# Patient Record
Sex: Female | Born: 1970 | Race: White | Hispanic: No | Smoking: Current every day smoker
Health system: Southern US, Community
[De-identification: ages and names within clinical notes are randomized; demographics above are authoritative.]

## PROBLEM LIST (undated history)

## (undated) DIAGNOSIS — A6920 Lyme disease, unspecified: Secondary | ICD-10-CM

## (undated) DIAGNOSIS — N809 Endometriosis, unspecified: Secondary | ICD-10-CM

## (undated) DIAGNOSIS — M549 Dorsalgia, unspecified: Secondary | ICD-10-CM

## (undated) DIAGNOSIS — G8929 Other chronic pain: Secondary | ICD-10-CM

## (undated) HISTORY — PX: ABDOMINAL HYSTERECTOMY: SHX81

## (undated) HISTORY — DX: Endometriosis, unspecified: N80.9

## (undated) HISTORY — PX: GASTRIC BYPASS: SHX52

---

## 2011-04-30 ENCOUNTER — Other Ambulatory Visit (HOSPITAL_COMMUNITY): Payer: Self-pay | Admitting: Physical Medicine and Rehabilitation

## 2011-04-30 ENCOUNTER — Ambulatory Visit (HOSPITAL_COMMUNITY)
Admission: RE | Admit: 2011-04-30 | Discharge: 2011-04-30 | Disposition: A | Discharge status: Home / Self Care | Payer: Medicaid Other | Source: Ambulatory Visit | Attending: Physical Medicine and Rehabilitation | Admitting: Physical Medicine and Rehabilitation

## 2011-04-30 DIAGNOSIS — M19019 Primary osteoarthritis, unspecified shoulder: Secondary | ICD-10-CM

## 2011-04-30 DIAGNOSIS — R6889 Other general symptoms and signs: Secondary | ICD-10-CM

## 2011-04-30 DIAGNOSIS — M545 Low back pain, unspecified: Secondary | ICD-10-CM

## 2011-04-30 DIAGNOSIS — R209 Unspecified disturbances of skin sensation: Secondary | ICD-10-CM

## 2011-04-30 DIAGNOSIS — IMO0002 Reserved for concepts with insufficient information to code with codable children: Secondary | ICD-10-CM

## 2011-04-30 DIAGNOSIS — M79609 Pain in unspecified limb: Secondary | ICD-10-CM

## 2011-04-30 DIAGNOSIS — M542 Cervicalgia: Secondary | ICD-10-CM

## 2011-04-30 DIAGNOSIS — M25519 Pain in unspecified shoulder: Secondary | ICD-10-CM | POA: Insufficient documentation

## 2011-04-30 DIAGNOSIS — M5126 Other intervertebral disc displacement, lumbar region: Secondary | ICD-10-CM

## 2011-05-04 ENCOUNTER — Other Ambulatory Visit: Payer: Self-pay | Admitting: Physical Medicine and Rehabilitation

## 2011-05-04 DIAGNOSIS — M545 Low back pain, unspecified: Secondary | ICD-10-CM

## 2011-05-04 DIAGNOSIS — M5126 Other intervertebral disc displacement, lumbar region: Secondary | ICD-10-CM

## 2011-05-07 ENCOUNTER — Other Ambulatory Visit: Payer: Medicaid Other

## 2012-10-05 ENCOUNTER — Emergency Department (HOSPITAL_COMMUNITY)
Admission: EM | Admit: 2012-10-05 | Discharge: 2012-10-05 | Disposition: A | Discharge status: Home / Self Care | Payer: Self-pay | Attending: Emergency Medicine | Admitting: Emergency Medicine

## 2012-10-05 ENCOUNTER — Emergency Department (HOSPITAL_COMMUNITY): Payer: Self-pay

## 2012-10-05 ENCOUNTER — Encounter (HOSPITAL_COMMUNITY): Payer: Self-pay | Admitting: *Deleted

## 2012-10-05 DIAGNOSIS — Y929 Unspecified place or not applicable: Secondary | ICD-10-CM | POA: Insufficient documentation

## 2012-10-05 DIAGNOSIS — W64XXXA Exposure to other animate mechanical forces, initial encounter: Secondary | ICD-10-CM | POA: Insufficient documentation

## 2012-10-05 DIAGNOSIS — F17200 Nicotine dependence, unspecified, uncomplicated: Secondary | ICD-10-CM | POA: Insufficient documentation

## 2012-10-05 DIAGNOSIS — M546 Pain in thoracic spine: Secondary | ICD-10-CM

## 2012-10-05 DIAGNOSIS — Z79899 Other long term (current) drug therapy: Secondary | ICD-10-CM | POA: Insufficient documentation

## 2012-10-05 DIAGNOSIS — G8929 Other chronic pain: Secondary | ICD-10-CM | POA: Insufficient documentation

## 2012-10-05 DIAGNOSIS — IMO0002 Reserved for concepts with insufficient information to code with codable children: Secondary | ICD-10-CM | POA: Insufficient documentation

## 2012-10-05 DIAGNOSIS — Y939 Activity, unspecified: Secondary | ICD-10-CM | POA: Insufficient documentation

## 2012-10-05 HISTORY — DX: Other chronic pain: G89.29

## 2012-10-05 HISTORY — DX: Dorsalgia, unspecified: M54.9

## 2012-10-05 NOTE — ED Provider Notes (Signed)
CSN: [id number]     Arrival date & time 10/05/12  1125 History   First MD Initiated Contact with Patient 10/05/12 1233     Chief Complaint  Patient presents with  . Back Pain   (Consider location/radiation/quality/duration/timing/severity/associated sxs/prior Treatment) HPI... patient was accidentally kicked by a horse in the mid back approximately one hour ago.   No head or neck trauma. No loss of consciousness. no anterior chest pain or dyspnea. Severity is mild to moderate. No other associated symptoms. Pain is sharp in nature.  Past Medical History  Diagnosis Date  . Chronic back pain    Past Surgical History  Procedure Laterality Date  . Abdominal hysterectomy    . Cesarean section    . Gastric bypass     No family history on file. History  Substance Use Topics  . Smoking status: Current Every Day Smoker    Types: Cigarettes  . Smokeless tobacco: Not on file  . Alcohol Use: No   OB History   Grav Para Term Preterm Abortions TAB SAB Ect Mult Living                 Review of Systems  All other systems reviewed and are negative.    Allergies  Dilaudid and Gabapentin  Home Medications   Current Outpatient Rx  Name  Route  Sig  Dispense  Refill  . morphine (MSIR) 30 MG tablet   Oral   Take 30 mg by mouth 2 (two) times daily.         . traMADol (ULTRAM) 50 MG tablet   Oral   Take 50 mg by mouth every 6 (six) hours as needed for pain.          BP 125/73  Pulse 77  Temp(Src) 98.5 F (36.9 C) (Oral)  Resp 20  Ht 5\' 4"  (1.626 m)  Wt 150 lb (68.04 kg)  BMI 25.73 kg/m2  SpO2 97% Physical Exam  Nursing note and vitals reviewed. Constitutional: She is oriented to person, place, and time. She appears well-developed and well-nourished.  HENT:  Head: Normocephalic and atraumatic.  Eyes: Conjunctivae and EOM are normal. Pupils are equal, round, and reactive to light.  Neck: Normal range of motion. Neck supple.  Cardiovascular: Normal rate, regular rhythm  and normal heart sounds.   Pulmonary/Chest: Effort normal and breath sounds normal.  Abdominal: Soft. Bowel sounds are normal.  Musculoskeletal:  Minimal muscular tenderness surrounding this thoracic spine approximately T8-10  Neurological: She is alert and oriented to person, place, and time.  Skin: Skin is warm and dry.  Psychiatric: She has a normal mood and affect.    ED Course  Procedures (including critical care time) Labs Review Labs Reviewed - No data to display Imaging Review Dg Ribs Unilateral W/chest Right  10/05/2012   *RADIOLOGY REPORT*  Clinical Data: Kicked in back by horse.  Posterior rib pain.  RIGHT RIBS AND CHEST - 3+ VIEW  Comparison: None.  Findings: Frontal view of the chest shows midline trachea and normal heart size.  Lungs are clear.  No pleural fluid.  Dedicated views of the right ribs show no definite fracture.  IMPRESSION: No acute findings.   Original Report Authenticated By: Leanna Battles, M.D.   Dg Thoracic Spine 2 View  10/05/2012   *RADIOLOGY REPORT*  Clinical Data: Kicked in back by horse.  Posterior rib pain.  THORACIC SPINE - 2 VIEW  Comparison: None.  Findings: Mild levoconvex curvature of the thoracolumbar junction may be  positional.  Alignment is otherwise anatomic.  Vertebral body height is maintained.  Minimal endplate degenerative change in the upper thoracic spine.  Cervicothoracic junction is in alignment.  IMPRESSION: No fracture or subluxation.   Original Report Authenticated By: Leanna Battles, M.D.    MDM   1. Thoracic back pain    Patient is ambulatory without dyspnea. Pulse ox 97%. Nontender over costovertebral angles.  Plain films of the thoracic spine negative    Donnetta Hutching, MD 10/05/12 1329

## 2012-10-05 NOTE — ED Notes (Signed)
Pt c/o back pain since yesterday afternoon. Pt states she was kicked in the back by a horse. Pt is ambulatory but states any movement increases the pain. Pt states "I feel like I have a deep pressure in my back and into my ribs".

## 2012-10-05 NOTE — ED Notes (Signed)
C/o mid back pain since yesterday after being kicked by a horse.   States hurts to take a deep breath.

## 2013-10-19 ENCOUNTER — Encounter (HOSPITAL_COMMUNITY): Payer: Self-pay | Admitting: Emergency Medicine

## 2013-10-19 ENCOUNTER — Emergency Department (HOSPITAL_COMMUNITY)
Admission: EM | Admit: 2013-10-19 | Discharge: 2013-10-20 | Disposition: A | Discharge status: Home / Self Care | Payer: Medicaid Other | Attending: Emergency Medicine | Admitting: Emergency Medicine

## 2013-10-19 DIAGNOSIS — F17200 Nicotine dependence, unspecified, uncomplicated: Secondary | ICD-10-CM | POA: Insufficient documentation

## 2013-10-19 DIAGNOSIS — Z79899 Other long term (current) drug therapy: Secondary | ICD-10-CM | POA: Insufficient documentation

## 2013-10-19 DIAGNOSIS — J358 Other chronic diseases of tonsils and adenoids: Secondary | ICD-10-CM | POA: Insufficient documentation

## 2013-10-19 DIAGNOSIS — J039 Acute tonsillitis, unspecified: Secondary | ICD-10-CM

## 2013-10-19 DIAGNOSIS — J029 Acute pharyngitis, unspecified: Secondary | ICD-10-CM | POA: Insufficient documentation

## 2013-10-19 DIAGNOSIS — G8929 Other chronic pain: Secondary | ICD-10-CM | POA: Insufficient documentation

## 2013-10-19 MED ORDER — AMOXICILLIN 875 MG PO TABS: 875.0000 mg | ORAL_TABLET | Freq: Two times a day (BID) | ORAL | Status: DC

## 2013-10-19 MED ORDER — MAGIC MOUTHWASH W/LIDOCAINE: 5.0000 mL | Freq: Three times a day (TID) | ORAL | Status: DC | PRN

## 2013-10-19 MED ORDER — ACETAMINOPHEN 500 MG PO TABS
1000.0000 mg | ORAL_TABLET | Freq: Once | ORAL | Status: AC
  Administered 2013-10-19: 1000 mg via ORAL
  Filled 2013-10-19: qty 2

## 2013-10-19 MED ORDER — AMOXICILLIN 250 MG PO CAPS
500.0000 mg | ORAL_CAPSULE | Freq: Once | ORAL | Status: AC
  Administered 2013-10-19: 500 mg via ORAL
  Filled 2013-10-19: qty 2

## 2013-10-19 NOTE — Discharge Instructions (Signed)
Tonsillitis °Tonsillitis is an infection of the throat. This infection causes the tonsils to become red, tender, and puffy (swollen). Tonsils are groups of tissue at the back of your throat. If bacteria caused your infection, antibiotic medicine will be given to you. Sometimes symptoms of tonsillitis can be relieved with the use of steroid medicine. If your tonsillitis is severe and happens often, you may need to get your tonsils removed (tonsillectomy). °HOME CARE  °· Rest and sleep often. °· Drink enough fluids to keep your pee (urine) clear or pale yellow. °· While your throat is sore, eat soft or liquid foods like: °¨ Soup. °¨ Ice cream. °¨ Instant breakfast drinks. °· Eat frozen ice pops. °· Gargle with a warm or cold liquid to help soothe the throat. Gargle with a water and salt mix. Mix 1/4 teaspoon of salt and 1/4 teaspoon of baking soda in 1 cup of water. °· Only take medicines as told by your doctor. °· If you are given medicines (antibiotics), take them as told. Finish them even if you start to feel better. °GET HELP IF: °· You have large, tender lumps in your neck. °· You have a rash. °· You cough up green, yellow-brown, or bloody fluid. °· You cannot swallow liquids or food for 24 hours. °· You notice that only one of your tonsils is swollen. °GET HELP RIGHT AWAY IF:  °· You throw up (vomit). °· You have a very bad headache. °· You have a stiff neck. °· You have chest pain. °· You have trouble breathing or swallowing. °· You have bad throat pain, drooling, or your voice changes. °· You have bad pain not helped by medicine. °· You cannot fully open your mouth. °· You have redness, puffiness, or bad pain in the neck. °· You have a fever. °MAKE SURE YOU:  °· Understand these instructions. °· Will watch your condition. °· Will get help right away if you are not doing well or get worse. °Document Released: 07/15/2007 Document Revised: 01/31/2013 Document Reviewed: 07/15/2012 °[employer]® Patient Information  ©2015 [employer]. This information is not intended to replace advice given to you by your health care provider. Make sure you discuss any questions you have with your health care provider. ° °

## 2013-10-19 NOTE — ED Notes (Signed)
Fever, sore throat, headache.  Took tylenol at 4 pm  No n/v

## 2013-10-19 NOTE — ED Provider Notes (Signed)
CSN: [id number]     Arrival date & time 10/19/13  2052 History   First MD Initiated Contact with Patient 10/19/13 2303     Chief Complaint  Patient presents with  . Sore Throat     (Consider location/radiation/quality/duration/timing/severity/associated sxs/prior Treatment) Patient is a 43 y.o. female presenting with pharyngitis.  Sore Throat Associated symptoms include congestion and a sore throat. Pertinent negatives include no abdominal pain, arthralgias, chest pain, chills, coughing, fever, headaches, nausea, neck pain, numbness, rash or vomiting.   Leslie Bowers is a 43 y.o. female who takes morphine for chronic back pain, presents to the Emergency Department complaining of sore throat that began yesterday morning.  Developed a frontal headache and fever last evening that she states comes down after tylenol but returns within 4 hrs.  Pain to her throat with swallowing.  She has been taking OTC tylenol cold medication without relief.  She denies abdominal pain, rash, vomiting or diarrhea.  She also denies known strep exposure recently.  She mentions that she removed several ticks form her lower extremities 2 weeks ago and reports itching to those areas but denies other associated symptoms.     Past Medical History  Diagnosis Date  . Chronic back pain    Past Surgical History  Procedure Laterality Date  . Abdominal hysterectomy    . Cesarean section    . Gastric bypass     History reviewed. No pertinent family history. History  Substance Use Topics  . Smoking status: Current Every Day Smoker    Types: Cigarettes  . Smokeless tobacco: Not on file  . Alcohol Use: No   OB History   Grav Para Term Preterm Abortions TAB SAB Ect Mult Living                 Review of Systems  Constitutional: Negative for fever, chills, activity change and appetite change.  HENT: Positive for congestion and sore throat. Negative for ear pain, facial swelling, trouble swallowing and voice  change.   Eyes: Negative for pain and visual disturbance.  Respiratory: Negative for cough and shortness of breath.   Cardiovascular: Negative for chest pain.  Gastrointestinal: Negative for nausea, vomiting and abdominal pain.  Genitourinary: Negative for dysuria and flank pain.  Musculoskeletal: Negative for arthralgias, neck pain and neck stiffness.  Skin: Negative for color change and rash.  Neurological: Negative for dizziness, facial asymmetry, speech difficulty, numbness and headaches.  Hematological: Negative for adenopathy.  All other systems reviewed and are negative.     Allergies  Dilaudid and Gabapentin  Home Medications   Prior to Admission medications   Medication Sig Start Date End Date Taking? Authorizing Provider  morphine (MSIR) 30 MG tablet Take 30 mg by mouth 2 (two) times daily.    Historical Provider, MD  traMADol (ULTRAM) 50 MG tablet Take 50 mg by mouth every 6 (six) hours as needed for pain.    Historical Provider, MD   BP 116/67  Pulse 84  Temp(Src) 101.3 F (38.5 C) (Oral)  Resp 18  Ht  (1.626 m)  Wt 140 lb (63.504 kg)  BMI 24.02 kg/m2  SpO2 96% Physical Exam  Nursing note and vitals reviewed. Constitutional: She is oriented to person, place, and time. She appears well-developed and well-nourished. No distress.  HENT:  Head: Normocephalic and atraumatic.  Right Ear: Tympanic membrane and ear canal normal.  Left Ear: Tympanic membrane and ear canal normal.  Mouth/Throat: Uvula is midline and mucous membranes are normal. No  oral lesions. No trismus in the jaw. No uvula swelling. Oropharyngeal exudate, posterior oropharyngeal edema and posterior oropharyngeal erythema present. No tonsillar abscesses.  Eyes: Conjunctivae and EOM are normal. Pupils are equal, round, and reactive to light.  Neck: Normal range of motion, full passive range of motion without pain and phonation normal. Neck supple. No Kernig's sign noted. No thyromegaly present.   Cardiovascular: Normal rate, regular rhythm, normal heart sounds and intact distal pulses.   No murmur heard. Pulmonary/Chest: Effort normal and breath sounds normal. No respiratory distress.  Abdominal: Soft. Normal appearance and bowel sounds are normal. She exhibits no distension and no mass. There is no splenomegaly. There is no tenderness. There is no rebound and no guarding.  Musculoskeletal: Normal range of motion.  Lymphadenopathy:    She has cervical adenopathy.       Left cervical: Superficial cervical adenopathy present.  Neurological: She is alert and oriented to person, place, and time. She exhibits normal muscle tone. Coordination normal.  Skin: Skin is warm and dry.    ED Course  Procedures (including critical care time) Labs Review Labs Reviewed - No data to display  Imaging Review No results found.   EKG Interpretation None      MDM   Final diagnoses:  Tonsillitis with exudate    Patient is well appearing, non-toxic.  Airway patent.  No PTA.  Exudates present to left tonsil, diffuse erythema of oropharynx.  Pt agrees to continue tylenol for fever, fluids, Rx for magic mouthwash and amoxil.  She appears stable for d/c and agrees to close f/u with her PMD.      Azoria Abbett L. Hayven Fatima, PA-C 10/19/13 2356

## 2013-10-20 NOTE — ED Provider Notes (Signed)
Medical screening examination/treatment/procedure(s) were performed by non-physician practitioner and as supervising physician I was immediately available for consultation/collaboration.   Dione Booze, MD 10/20/13 [phone]

## 2014-02-20 ENCOUNTER — Ambulatory Visit: Payer: Medicaid Other | Attending: Audiology | Admitting: Audiology

## 2014-07-03 ENCOUNTER — Encounter (HOSPITAL_COMMUNITY): Payer: Self-pay | Admitting: Cardiology

## 2014-07-03 ENCOUNTER — Emergency Department (HOSPITAL_COMMUNITY)
Admission: EM | Admit: 2014-07-03 | Discharge: 2014-07-03 | Disposition: A | Discharge status: Home / Self Care | Payer: Medicaid Other | Attending: Emergency Medicine | Admitting: Emergency Medicine

## 2014-07-03 ENCOUNTER — Emergency Department (HOSPITAL_COMMUNITY): Payer: Medicaid Other

## 2014-07-03 DIAGNOSIS — R509 Fever, unspecified: Secondary | ICD-10-CM | POA: Insufficient documentation

## 2014-07-03 DIAGNOSIS — Z720 Tobacco use: Secondary | ICD-10-CM | POA: Insufficient documentation

## 2014-07-03 DIAGNOSIS — R1011 Right upper quadrant pain: Secondary | ICD-10-CM | POA: Insufficient documentation

## 2014-07-03 DIAGNOSIS — R109 Unspecified abdominal pain: Secondary | ICD-10-CM | POA: Diagnosis present

## 2014-07-03 DIAGNOSIS — Z90710 Acquired absence of both cervix and uterus: Secondary | ICD-10-CM | POA: Insufficient documentation

## 2014-07-03 DIAGNOSIS — G8929 Other chronic pain: Secondary | ICD-10-CM | POA: Diagnosis not present

## 2014-07-03 DIAGNOSIS — Z79899 Other long term (current) drug therapy: Secondary | ICD-10-CM | POA: Insufficient documentation

## 2014-07-03 DIAGNOSIS — R110 Nausea: Secondary | ICD-10-CM | POA: Diagnosis not present

## 2014-07-03 LAB — CBC WITH DIFFERENTIAL/PLATELET
BASOS ABS: 0 10*3/uL (ref 0.0–0.1)
BASOS PCT: 0 % (ref 0–1)
Eosinophils Absolute: 0 10*3/uL (ref 0.0–0.7)
Eosinophils Relative: 0 % (ref 0–5)
HCT: 41.9 % (ref 36.0–46.0)
HEMOGLOBIN: 14.5 g/dL (ref 12.0–15.0)
LYMPHS ABS: 1.8 10*3/uL (ref 0.7–4.0)
Lymphocytes Relative: 23 % (ref 12–46)
MCH: 31 pg (ref 26.0–34.0)
MCHC: 34.6 g/dL (ref 30.0–36.0)
MCV: 89.7 fL (ref 78.0–100.0)
MONO ABS: 0.4 10*3/uL (ref 0.1–1.0)
MONOS PCT: 5 % (ref 3–12)
NEUTROS ABS: 5.4 10*3/uL (ref 1.7–7.7)
NEUTROS PCT: 72 % (ref 43–77)
PLATELETS: 146 10*3/uL — AB (ref 150–400)
RBC: 4.67 MIL/uL (ref 3.87–5.11)
RDW: 12.3 % (ref 11.5–15.5)
WBC: 7.6 10*3/uL (ref 4.0–10.5)

## 2014-07-03 LAB — COMPREHENSIVE METABOLIC PANEL
ALK PHOS: 59 U/L (ref 38–126)
ALT: 10 U/L — ABNORMAL LOW (ref 14–54)
ANION GAP: 4 — AB (ref 5–15)
AST: 15 U/L (ref 15–41)
Albumin: 3.8 g/dL (ref 3.5–5.0)
BUN: 6 mg/dL (ref 6–20)
CHLORIDE: 103 mmol/L (ref 101–111)
CO2: 29 mmol/L (ref 22–32)
Calcium: 8.3 mg/dL — ABNORMAL LOW (ref 8.9–10.3)
Creatinine, Ser: 0.75 mg/dL (ref 0.44–1.00)
GFR calc non Af Amer: >60 mL/min (ref >60)
GLUCOSE: 99 mg/dL (ref 65–99)
POTASSIUM: 3.9 mmol/L (ref 3.5–5.1)
Sodium: 136 mmol/L (ref 135–145)
TOTAL PROTEIN: 6.5 g/dL (ref 6.5–8.1)
Total Bilirubin: 0.7 mg/dL (ref 0.3–1.2)

## 2014-07-03 LAB — URINALYSIS, ROUTINE W REFLEX MICROSCOPIC
BILIRUBIN URINE: NEGATIVE
GLUCOSE, UA: NEGATIVE mg/dL
Hgb urine dipstick: NEGATIVE
KETONES UR: NEGATIVE mg/dL
Leukocytes, UA: NEGATIVE
Nitrite: NEGATIVE
PH: 6 (ref 5.0–8.0)
Protein, ur: NEGATIVE mg/dL
Specific Gravity, Urine: 1.01 (ref 1.005–1.030)
Urobilinogen, UA: 0.2 mg/dL (ref 0.0–1.0)

## 2014-07-03 LAB — LIPASE, BLOOD: Lipase: 21 U/L — ABNORMAL LOW (ref 22–51)

## 2014-07-03 MED ORDER — MORPHINE SULFATE 4 MG/ML IJ SOLN
4.0000 mg | Freq: Once | INTRAMUSCULAR | Status: AC
  Administered 2014-07-03: 4 mg via INTRAVENOUS
  Filled 2014-07-03: qty 1

## 2014-07-03 MED ORDER — ONDANSETRON HCL 4 MG/2ML IJ SOLN
4.0000 mg | Freq: Once | INTRAMUSCULAR | Status: AC
  Administered 2014-07-03: 4 mg via INTRAVENOUS
  Filled 2014-07-03: qty 2

## 2014-07-03 NOTE — ED Provider Notes (Signed)
CSN: [id number]     Arrival date & time 07/03/14  1443 History   First MD Initiated Contact with Patient 07/03/14 1638     Chief Complaint  Patient presents with  . Abdominal Pain    Patient is a 44 y.o. female presenting with abdominal pain. The history is provided by the patient.  Abdominal Pain Pain location:  RUQ Pain quality: sharp   Pain radiates to:  Does not radiate Pain severity:  Moderate Onset quality:  Gradual Timing:  Intermittent Progression:  Worsening Chronicity:  Recurrent Relieved by:  Nothing Worsened by:  Movement, palpation and eating Associated symptoms: fever and nausea   Associated symptoms: no chest pain, no dysuria, no shortness of breath and no vomiting   PT reports intermittent episodes of RUQ pain for past 2 months, but worse over past 24 hours It has been constant for 24 hours No vomiting but reports nausea She reports there is some association with eating  She has h/o gastric bypass about 6 yrs ago  Past Medical History  Diagnosis Date  . Chronic back pain    Past Surgical History  Procedure Laterality Date  . Abdominal hysterectomy    . Cesarean section    . Gastric bypass     History reviewed. No pertinent family history. History  Substance Use Topics  . Smoking status: Current Every Day Smoker    Types: Cigarettes  . Smokeless tobacco: Not on file  . Alcohol Use: No   OB History    No data available     Review of Systems  Constitutional: Positive for fever.  Respiratory: Negative for shortness of breath.   Cardiovascular: Negative for chest pain.  Gastrointestinal: Positive for nausea and abdominal pain. Negative for vomiting.  Genitourinary: Negative for dysuria.  All other systems reviewed and are negative.     Allergies  Dilaudid and Gabapentin  Home Medications   Prior to Admission medications   Medication Sig Start Date End Date Taking? Authorizing Provider  morphine (MS CONTIN) 15 MG 12 hr tablet Take 15 mg by  mouth every 12 (twelve) hours. 06/08/14  Yes Historical Provider, MD  morphine (MSIR) 30 MG tablet Take 30 mg by mouth 2 (two) times daily.   Yes Historical Provider, MD  tiZANidine (ZANAFLEX) 2 MG tablet Take 2 mg by mouth every 8 (eight) hours as needed. 06/07/14  Yes Historical Provider, MD  traMADol (ULTRAM) 50 MG tablet Take 50 mg by mouth every 6 (six) hours as needed for pain.   Yes Historical Provider, MD  Alum & Mag Hydroxide-Simeth (MAGIC MOUTHWASH W/LIDOCAINE) SOLN Take 5 mLs by mouth 3 (three) times daily as needed for mouth pain. Swish and spit, do not swallow Patient not taking: Reported on 07/03/2014 10/19/13   Tammy Triplett, PA-C  amoxicillin (AMOXIL) 875 MG tablet Take 1 tablet (875 mg total) by mouth 2 (two) times daily. For 10 days Patient not taking: Reported on 07/03/2014 10/19/13   Tammy Triplett, PA-C   BP 118/74 mmHg  Pulse 55  Temp(Src) 98.7 F (37.1 C) (Oral)  Resp 18  Ht 5\' 4"  (1.626 m)  Wt 148 lb (67.132 kg)  BMI 25.39 kg/m2  SpO2 98% Physical Exam CONSTITUTIONAL: Well developed/well nourished HEAD: Normocephalic/atraumatic EYES: EOMI/PERRL, no icterus ENMT: Mucous membranes moist NECK: supple no meningeal signs SPINE/BACK:entire spine nontender CV: S1/S2 noted, no murmurs/rubs/gallops noted LUNGS: Lungs are clear to auscultation bilaterally, no apparent distress ABDOMEN: soft, moderate RUQ tenderness, no rebound or guarding, bowel sounds noted throughout  abdomen GU:no cva tenderness NEURO: Pt is awake/alert/appropriate, moves all extremitiesx4.  No facial droop.   EXTREMITIES: pulses normal/equal, full ROM SKIN: warm, color normal PSYCH: no abnormalities of mood noted, alert and oriented to situation  ED Course  Procedures   6:52 PM Pt improved She is resting comfortably She has no CP She has mild abd tenderness No signs cholelithiasis on [place] Given h/o gastric bypass, complication is possible though she is well appearing, nontoxic, vitals appropriate  and labs unremarkable.  I gave her option of further testing including CT imaging, and she declined She will f/u with PCP and GI referral given We discussed strict return precautions    Medications  morphine 4 MG/ML injection 4 mg (4 mg Intravenous Given 07/03/14 1717)  ondansetron (ZOFRAN) injection 4 mg (4 mg Intravenous Given 07/03/14 1717)    Labs Review Labs Reviewed  CBC WITH DIFFERENTIAL/PLATELET - Abnormal; Notable for the following:    Platelets 146 (*)    All other components within normal limits  COMPREHENSIVE METABOLIC PANEL - Abnormal; Notable for the following:    Calcium 8.3 (*)    ALT 10 (*)    Anion gap 4 (*)    All other components within normal limits  LIPASE, BLOOD - Abnormal; Notable for the following:    Lipase 21 (*)    All other components within normal limits  URINALYSIS, ROUTINE W REFLEX MICROSCOPIC    Imaging Review US Abdomen Limited Ruq  07/03/2014   CLINICAL DATA:  Right upper quadrant pain for 1 month, history of gastric bypass  EXAM: US ABDOMEN LIMITED - RIGHT UPPER QUADRANT  COMPARISON:  None.  FINDINGS: Gallbladder:  No gallstones or wall thickening visualized. No sonographic Murphy sign noted.  Common bile duct:  Diameter: 5 mm in diameter within normal limits.  Liver:  No focal lesion identified. Within normal limits in parenchymal echogenicity.  IMPRESSION: Unremarkable right upper quadrant ultrasound.   Electronically Signed   By: Natasha Mead M.D.   On: 07/03/2014 17:34     MDM   Final diagnoses:  Abdominal pain  RUQ abdominal pain    Nursing notes including past medical history and social history reviewed and considered in documentation Labs/vital reviewed myself and considered during evaluation     Zadie Rhine, MD 07/03/14 [phone]

## 2014-07-03 NOTE — ED Notes (Signed)
MD at bedside. 

## 2014-07-03 NOTE — Discharge Instructions (Signed)

## 2014-07-03 NOTE — ED Notes (Signed)
RUQ abdominal pain off and on times 2 months.  Has had pain since yesterday.  Nausea.

## 2015-03-12 ENCOUNTER — Other Ambulatory Visit (HOSPITAL_COMMUNITY): Payer: Self-pay | Admitting: Physician Assistant

## 2015-03-12 DIAGNOSIS — Z1231 Encounter for screening mammogram for malignant neoplasm of breast: Secondary | ICD-10-CM

## 2015-03-21 ENCOUNTER — Ambulatory Visit (HOSPITAL_COMMUNITY)
Admission: RE | Admit: 2015-03-21 | Discharge: 2015-03-21 | Disposition: A | Discharge status: Home / Self Care | Payer: Medicaid Other | Source: Ambulatory Visit | Attending: Physician Assistant | Admitting: Physician Assistant

## 2015-03-21 DIAGNOSIS — Z1231 Encounter for screening mammogram for malignant neoplasm of breast: Secondary | ICD-10-CM | POA: Diagnosis present

## 2015-03-21 DIAGNOSIS — R921 Mammographic calcification found on diagnostic imaging of breast: Secondary | ICD-10-CM | POA: Insufficient documentation

## 2015-03-28 ENCOUNTER — Other Ambulatory Visit: Payer: Self-pay | Admitting: Physician Assistant

## 2015-03-28 DIAGNOSIS — R928 Other abnormal and inconclusive findings on diagnostic imaging of breast: Secondary | ICD-10-CM

## 2015-03-29 ENCOUNTER — Other Ambulatory Visit: Payer: Self-pay | Admitting: Physician Assistant

## 2015-03-29 DIAGNOSIS — R928 Other abnormal and inconclusive findings on diagnostic imaging of breast: Secondary | ICD-10-CM

## 2015-03-30 ENCOUNTER — Encounter (HOSPITAL_COMMUNITY): Payer: Self-pay | Admitting: Emergency Medicine

## 2015-03-30 ENCOUNTER — Emergency Department (HOSPITAL_COMMUNITY): Payer: Medicaid Other

## 2015-03-30 ENCOUNTER — Emergency Department (HOSPITAL_COMMUNITY)
Admission: EM | Admit: 2015-03-30 | Discharge: 2015-03-30 | Disposition: A | Discharge status: Home / Self Care | Payer: Medicaid Other | Attending: Emergency Medicine | Admitting: Emergency Medicine

## 2015-03-30 DIAGNOSIS — Z9884 Bariatric surgery status: Secondary | ICD-10-CM | POA: Insufficient documentation

## 2015-03-30 DIAGNOSIS — Z90710 Acquired absence of both cervix and uterus: Secondary | ICD-10-CM | POA: Diagnosis not present

## 2015-03-30 DIAGNOSIS — G8929 Other chronic pain: Secondary | ICD-10-CM | POA: Insufficient documentation

## 2015-03-30 DIAGNOSIS — R1011 Right upper quadrant pain: Secondary | ICD-10-CM | POA: Diagnosis present

## 2015-03-30 DIAGNOSIS — F17210 Nicotine dependence, cigarettes, uncomplicated: Secondary | ICD-10-CM | POA: Insufficient documentation

## 2015-03-30 LAB — CBC WITH DIFFERENTIAL/PLATELET
BASOS PCT: 0 %
Basophils Absolute: 0 10*3/uL (ref 0.0–0.1)
EOS ABS: 0.1 10*3/uL (ref 0.0–0.7)
Eosinophils Relative: 1 %
HEMATOCRIT: 43.5 % (ref 36.0–46.0)
HEMOGLOBIN: 14.9 g/dL (ref 12.0–15.0)
LYMPHS ABS: 2.4 10*3/uL (ref 0.7–4.0)
Lymphocytes Relative: 29 %
MCH: 30.8 pg (ref 26.0–34.0)
MCHC: 34.3 g/dL (ref 30.0–36.0)
MCV: 89.9 fL (ref 78.0–100.0)
Monocytes Absolute: 0.4 10*3/uL (ref 0.1–1.0)
Monocytes Relative: 5 %
NEUTROS ABS: 5.5 10*3/uL (ref 1.7–7.7)
NEUTROS PCT: 65 %
Platelets: 164 10*3/uL (ref 150–400)
RBC: 4.84 MIL/uL (ref 3.87–5.11)
RDW: 12.4 % (ref 11.5–15.5)
WBC: 8.4 10*3/uL (ref 4.0–10.5)

## 2015-03-30 LAB — COMPREHENSIVE METABOLIC PANEL
ALBUMIN: 4 g/dL (ref 3.5–5.0)
ALK PHOS: 63 U/L (ref 38–126)
ALT: 10 U/L — AB (ref 14–54)
AST: 15 U/L (ref 15–41)
Anion gap: 4 — ABNORMAL LOW (ref 5–15)
BUN: 11 mg/dL (ref 6–20)
CALCIUM: 8.6 mg/dL — AB (ref 8.9–10.3)
CO2: 29 mmol/L (ref 22–32)
CREATININE: 0.63 mg/dL (ref 0.44–1.00)
Chloride: 107 mmol/L (ref 101–111)
GFR calc Af Amer: >60 mL/min (ref >60)
GFR calc non Af Amer: >60 mL/min (ref >60)
GLUCOSE: 80 mg/dL (ref 65–99)
Potassium: 4.1 mmol/L (ref 3.5–5.1)
SODIUM: 140 mmol/L (ref 135–145)
Total Bilirubin: 0.6 mg/dL (ref 0.3–1.2)
Total Protein: 6.7 g/dL (ref 6.5–8.1)

## 2015-03-30 LAB — URINALYSIS, ROUTINE W REFLEX MICROSCOPIC
BILIRUBIN URINE: NEGATIVE
GLUCOSE, UA: NEGATIVE mg/dL
HGB URINE DIPSTICK: NEGATIVE
Ketones, ur: NEGATIVE mg/dL
Leukocytes, UA: NEGATIVE
NITRITE: NEGATIVE
PH: 7 (ref 5.0–8.0)
Protein, ur: NEGATIVE mg/dL

## 2015-03-30 LAB — LIPASE, BLOOD: Lipase: 33 U/L (ref 11–51)

## 2015-03-30 MED ORDER — SODIUM CHLORIDE 0.9 % IV BOLUS (SEPSIS)
1000.0000 mL | Freq: Once | INTRAVENOUS | Status: AC
  Administered 2015-03-30: 1000 mL via INTRAVENOUS

## 2015-03-30 MED ORDER — IOHEXOL 300 MG/ML  SOLN
100.0000 mL | Freq: Once | INTRAMUSCULAR | Status: AC | PRN
  Administered 2015-03-30: 100 mL via INTRAVENOUS

## 2015-03-30 MED ORDER — ONDANSETRON HCL 4 MG/2ML IJ SOLN
4.0000 mg | Freq: Once | INTRAMUSCULAR | Status: AC
  Administered 2015-03-30: 4 mg via INTRAVENOUS
  Filled 2015-03-30: qty 2

## 2015-03-30 MED ORDER — ONDANSETRON HCL 8 MG PO TABS: 8.0000 mg | ORAL_TABLET | Freq: Three times a day (TID) | ORAL | Status: DC | PRN

## 2015-03-30 MED ORDER — IOHEXOL 300 MG/ML  SOLN
25.0000 mL | Freq: Once | INTRAMUSCULAR | Status: AC | PRN
Start: 2015-03-30 — End: 2015-03-30
  Administered 2015-03-30: 25 mL via INTRAVENOUS

## 2015-03-30 MED ORDER — FENTANYL CITRATE (PF) 100 MCG/2ML IJ SOLN
100.0000 ug | Freq: Once | INTRAMUSCULAR | Status: AC
  Administered 2015-03-30: 100 ug via INTRAVENOUS
  Filled 2015-03-30: qty 2

## 2015-03-30 MED ORDER — TRAMADOL HCL 50 MG PO TABS: 50.0000 mg | ORAL_TABLET | Freq: Four times a day (QID) | ORAL | Status: AC | PRN

## 2015-03-30 NOTE — ED Notes (Signed)
Patient began experiencing mid abdominal pain on Wednesday which has progressively worsened. Also states she has had nausea with no vomiting and diarrhea aver 2-3 each day, including today.

## 2015-03-30 NOTE — ED Provider Notes (Signed)
CSN: [id number]     Arrival date & time 03/30/15  1547 History   First MD Initiated Contact with Patient 03/30/15 1611     Chief Complaint  Patient presents with  . Abdominal Pain     (Consider location/radiation/quality/duration/timing/severity/associated sxs/prior Treatment) HPI.... Right upper quadrant pain radiating to the back for several days. Patient is status post gastric bypass in 2010 in [place]. At that time she was told she had gallbladder problems and was given a "medication" which seemed to help. No fever, sweats, chills, cough, vomiting, chest pain, dyspnea, dysuria. Severity of symptoms is mild to moderate.  Past Medical History  Diagnosis Date  . Chronic back pain    Past Surgical History  Procedure Laterality Date  . Abdominal hysterectomy    . Cesarean section    . Gastric bypass     History reviewed. No pertinent family history. Social History  Substance Use Topics  . Smoking status: Current Every Day Smoker    Types: Cigarettes  . Smokeless tobacco: None  . Alcohol Use: No   OB History    No data available     Review of Systems    Allergies  Dilaudid and Gabapentin  Home Medications   Prior to Admission medications   Medication Sig Start Date End Date Taking? Authorizing Provider  tiZANidine (ZANAFLEX) 2 MG tablet Take 2 mg by mouth every 8 (eight) hours as needed for muscle spasms.  06/07/14  Yes Historical Provider, MD  ondansetron (ZOFRAN) 8 MG tablet Take 1 tablet (8 mg total) by mouth 3 (three) times daily as needed. 03/30/15   Donnetta Hutching, MD  traMADol (ULTRAM) 50 MG tablet Take 1 tablet (50 mg total) by mouth every 6 (six) hours as needed. 03/30/15   Donnetta Hutching, MD   BP 119/81 mmHg  Pulse 62  Temp(Src) 98.4 F (36.9 C) (Oral)  Resp 20  Ht  (1.626 m)  Wt 150 lb (68.04 kg)  BMI 25.73 kg/m2  SpO2 97% Physical Exam  Constitutional: She is oriented to person, place, and time. She appears well-developed and well-nourished.  HENT:   Head: Normocephalic and atraumatic.  Eyes: Conjunctivae and EOM are normal. Pupils are equal, round, and reactive to light.  Neck: Normal range of motion. Neck supple.  Cardiovascular: Normal rate and regular rhythm.   Pulmonary/Chest: Effort normal and breath sounds normal.  Abdominal: Soft. Bowel sounds are normal.  Minimal right upper quadrant tenderness  Musculoskeletal: Normal range of motion.  Neurological: She is alert and oriented to person, place, and time.  Skin: Skin is warm and dry.  Psychiatric: She has a normal mood and affect. Her behavior is normal.  Nursing note and vitals reviewed.   ED Course  Procedures (including critical care time) Labs Review Labs Reviewed  COMPREHENSIVE METABOLIC PANEL - Abnormal; Notable for the following:    Calcium 8.6 (*)    ALT 10 (*)    Anion gap 4 (*)    All other components within normal limits  URINALYSIS, ROUTINE W REFLEX MICROSCOPIC (NOT AT [facility]) - Abnormal; Notable for the following:    Specific Gravity, Urine <1.005 (*)    All other components within normal limits  CBC WITH DIFFERENTIAL/PLATELET  LIPASE, BLOOD    Imaging Review Ct Abdomen Pelvis W Contrast  03/30/2015  CLINICAL DATA:  Right upper quadrant pain 3 days with nausea and diarrhea. Previous gastric bypass surgery. EXAM: CT ABDOMEN AND PELVIS WITH CONTRAST TECHNIQUE: Multidetector CT imaging of the abdomen and pelvis  was performed using the standard protocol following bolus administration of intravenous contrast. CONTRAST:  25mL OMNIPAQUE IOHEXOL 300 MG/ML SOLN, OMNIPAQUE IOHEXOL 300 MG/ML SOLN COMPARISON:  None. FINDINGS: Lung bases demonstrate a 3 mm peripheral nodule over the right middle lobe. There is subtle increased interstitial markings over the right lower lobe just above the diaphragm with associated linear density likely chronic scarring, however cannot exclude in a developing atypical infectious or inflammatory process. Multiple surgical clips over the  stomach compatible with previous bypass procedure. The liver, spleen, pancreas, gallbladder and adrenal glands are within normal. Kidneys are normal in size without hydronephrosis or nephrolithiasis. There is a 1.2 cm hypodensity over the lower pole cortex of the right kidney with possible mild enhancement. Appendix is normal. Remainder of the colon is within normal. Small bowel is within normal. Vascular structures are within normal. There is no free fluid or focal inflammatory change. Pelvic images demonstrate the bladder and rectum to be within normal. Surgical absence of the uterus. Adnexal regions are within normal. There is mild spondylosis of the lower lumbar spine with disc disease the L4-5 and L5-S1 levels. Minimal degenerate change of the hips. IMPRESSION: No acute findings in the abdomen/ pelvis. Subtle focal interstitial change with linear density over the right lower lobe likely chronic scarring, although cannot exclude developing atypical infectious or inflammatory process. Recommend followup CT 4 weeks. 1.2 cm indeterminate hypodensity over the lower pole right renal cortex with possible enhancement. Recommend MRI on an elective basis for further characterization. 3 mm peripheral nodule over the right middle lobe. Recommend followup CT 1 year. This recommendation follows the consensus statement: Guidelines for Management of Small Pulmonary Nodules Detected on CT Scans: A Statement from the Fleischner Society as published in Radiology 2005; 237:395-400. Online at: DietDisorder.cz. Postsurgical change compatible previous gastric bypass. Electronically Signed   By: Elberta Fortis M.D.   On: 03/30/2015 19:04   I have personally reviewed and evaluated these images and lab results as part of my medical decision-making.   EKG Interpretation None      MDM   Final diagnoses:  RUQ abdominal pain    Uncertain etiology of patient's right upper quadrant pain.  Her vital signs were normal. White count, Liver functions and lipase were normal. There are several anomalies on the CT scan of the abdomen and pelvis. These were all discussed with the patient and recommendations for follow-up made. The patient took a copy of the CT scan so she could show her primary care doctor. Discharge medications Percocet and Zofran 8 mg    Donnetta Hutching, MD 03/30/15 2023

## 2015-03-30 NOTE — Discharge Instructions (Signed)
We discussed your CT scan.  Need follow up with your primary care doctor. Medication for pain and nausea. Return here if worse.

## 2015-04-09 ENCOUNTER — Ambulatory Visit (HOSPITAL_COMMUNITY)
Admission: RE | Admit: 2015-04-09 | Discharge: 2015-04-09 | Disposition: A | Discharge status: Home / Self Care | Payer: Medicaid Other | Source: Ambulatory Visit | Attending: Physician Assistant | Admitting: Physician Assistant

## 2015-04-09 ENCOUNTER — Other Ambulatory Visit (HOSPITAL_COMMUNITY): Payer: Self-pay | Admitting: Family

## 2015-04-09 DIAGNOSIS — R928 Other abnormal and inconclusive findings on diagnostic imaging of breast: Secondary | ICD-10-CM

## 2015-04-09 DIAGNOSIS — R918 Other nonspecific abnormal finding of lung field: Secondary | ICD-10-CM

## 2015-04-09 DIAGNOSIS — R921 Mammographic calcification found on diagnostic imaging of breast: Secondary | ICD-10-CM | POA: Insufficient documentation

## 2015-04-22 ENCOUNTER — Ambulatory Visit (HOSPITAL_COMMUNITY)
Admission: RE | Admit: 2015-04-22 | Discharge: 2015-04-22 | Disposition: A | Discharge status: Home / Self Care | Payer: Medicaid Other | Source: Ambulatory Visit | Attending: Family | Admitting: Family

## 2015-04-22 DIAGNOSIS — F17200 Nicotine dependence, unspecified, uncomplicated: Secondary | ICD-10-CM | POA: Insufficient documentation

## 2015-04-22 DIAGNOSIS — R918 Other nonspecific abnormal finding of lung field: Secondary | ICD-10-CM

## 2015-04-22 MED ORDER — IOHEXOL 300 MG/ML  SOLN
75.0000 mL | Freq: Once | INTRAMUSCULAR | Status: AC | PRN
  Administered 2015-04-22: 75 mL via INTRAVENOUS

## 2015-10-24 ENCOUNTER — Other Ambulatory Visit (HOSPITAL_COMMUNITY): Payer: Self-pay | Admitting: Family

## 2015-10-24 DIAGNOSIS — R928 Other abnormal and inconclusive findings on diagnostic imaging of breast: Secondary | ICD-10-CM

## 2015-10-29 ENCOUNTER — Encounter (HOSPITAL_COMMUNITY): Payer: Medicaid Other

## 2015-11-12 ENCOUNTER — Encounter (HOSPITAL_COMMUNITY): Payer: Medicaid Other

## 2015-12-17 ENCOUNTER — Encounter (HOSPITAL_COMMUNITY): Payer: Self-pay | Admitting: Emergency Medicine

## 2015-12-17 ENCOUNTER — Emergency Department (HOSPITAL_COMMUNITY)
Admission: EM | Admit: 2015-12-17 | Discharge: 2015-12-17 | Disposition: A | Discharge status: Home / Self Care | Payer: Medicaid Other | Attending: Emergency Medicine | Admitting: Emergency Medicine

## 2015-12-17 DIAGNOSIS — Y9389 Activity, other specified: Secondary | ICD-10-CM | POA: Insufficient documentation

## 2015-12-17 DIAGNOSIS — Z79899 Other long term (current) drug therapy: Secondary | ICD-10-CM | POA: Insufficient documentation

## 2015-12-17 DIAGNOSIS — Y929 Unspecified place or not applicable: Secondary | ICD-10-CM | POA: Insufficient documentation

## 2015-12-17 DIAGNOSIS — M549 Dorsalgia, unspecified: Secondary | ICD-10-CM | POA: Insufficient documentation

## 2015-12-17 DIAGNOSIS — S40861A Insect bite (nonvenomous) of right upper arm, initial encounter: Secondary | ICD-10-CM | POA: Insufficient documentation

## 2015-12-17 DIAGNOSIS — W57XXXA Bitten or stung by nonvenomous insect and other nonvenomous arthropods, initial encounter: Secondary | ICD-10-CM | POA: Insufficient documentation

## 2015-12-17 DIAGNOSIS — F17210 Nicotine dependence, cigarettes, uncomplicated: Secondary | ICD-10-CM | POA: Insufficient documentation

## 2015-12-17 DIAGNOSIS — Y999 Unspecified external cause status: Secondary | ICD-10-CM | POA: Insufficient documentation

## 2015-12-17 MED ORDER — DOXYCYCLINE HYCLATE 100 MG PO TABS
100.0000 mg | ORAL_TABLET | Freq: Once | ORAL | Status: AC
Start: 2015-12-17 — End: 2015-12-17
  Administered 2015-12-17: 100 mg via ORAL
  Filled 2015-12-17: qty 1

## 2015-12-17 MED ORDER — DOXYCYCLINE HYCLATE 100 MG PO CAPS: 100.0000 mg | ORAL_CAPSULE | Freq: Two times a day (BID) | ORAL | 0 refills | Status: DC

## 2015-12-17 MED ORDER — ONDANSETRON HCL 4 MG PO TABS
4.0000 mg | ORAL_TABLET | Freq: Once | ORAL | Status: AC
  Administered 2015-12-17: 4 mg via ORAL
  Filled 2015-12-17: qty 1

## 2015-12-17 MED ORDER — IBUPROFEN 800 MG PO TABS
800.0000 mg | ORAL_TABLET | Freq: Once | ORAL | Status: AC
  Administered 2015-12-17: 800 mg via ORAL
  Filled 2015-12-17: qty 1

## 2015-12-17 NOTE — Discharge Instructions (Signed)
Your vital signs are within normal limits. Your oxygen level is 97% on room air. Please use warm compresses to the bite to your arm to 3 times daily. Benadryl cream to the area may be helpful. Continue your current medications. She please add doxycycline 2 times daily with food. Please see Dr. Fran LowesHolder, or return to the emergency department if signs of advancing infection.

## 2015-12-17 NOTE — ED Provider Notes (Signed)
[facility] Provider Note   CSN: [id number] Arrival date & time: 12/17/15  1157     History   Chief Complaint Chief Complaint  Patient presents with  . Insect Bite    HPI Leslie Bowers is a 45 y.o. female.  Patient is a 45 year old female who presents to the emergency department with a complaint of insect bite to the right arm.  The patient states that on November 3 she sustained a stinging burning sensation of the inner aspect of her right arm/elbow. The area continues to get red and causing her pain with movement of her arm. She has not lost any control of her fingers or wrists or strength of her arm. She sweats, nausea vomiting, shortness of breath, chest pain, or any other complications. She only has pain in the area of the bite on.   The history is provided by the patient.    Past Medical History:  Diagnosis Date  . Chronic back pain     There are no active problems to display for this patient.   Past Surgical History:  Procedure Laterality Date  . ABDOMINAL HYSTERECTOMY    . CESAREAN SECTION    . GASTRIC BYPASS      OB History    Gravida Para Term Preterm AB Living   3         3   SAB TAB Ectopic Multiple Live Births                   Home Medications    Prior to Admission medications   Medication Sig Start Date End Date Taking? Authorizing Provider  pregabalin (LYRICA) 75 MG capsule Take 75 mg by mouth 2 (two) times daily.   Yes Historical Provider, MD  tiZANidine (ZANAFLEX) 2 MG tablet Take 2 mg by mouth every 8 (eight) hours as needed for muscle spasms.  06/07/14  Yes Historical Provider, MD  traMADol (ULTRAM) 50 MG tablet Take 1 tablet (50 mg total) by mouth every 6 (six) hours as needed. 03/30/15  Yes Donnetta HutchingBrian Cook, MD  ondansetron (ZOFRAN) 8 MG tablet Take 1 tablet (8 mg total) by mouth 3 (three) times daily as needed. Patient not taking: Reported on 12/17/2015 03/30/15   Donnetta HutchingBrian Cook, MD    Family History History reviewed. No pertinent  family history.  Social History Social History  Substance Use Topics  . Smoking status: Current Every Day Smoker    Packs/day: 1.00    Types: Cigarettes  . Smokeless tobacco: Never Used  . Alcohol use No     Allergies   Dilaudid [hydromorphone hcl] and Gabapentin   Review of Systems Review of Systems  Constitutional: Negative for activity change.       All ROS Neg except as noted in HPI  HENT: Negative for nosebleeds.   Eyes: Negative for photophobia and discharge.  Respiratory: Negative for cough, shortness of breath and wheezing.   Cardiovascular: Negative for chest pain and palpitations.  Gastrointestinal: Negative for abdominal pain and blood in stool.  Genitourinary: Negative for dysuria, frequency and hematuria.  Musculoskeletal: Positive for back pain. Negative for arthralgias and neck pain.  Skin: Negative.   Neurological: Negative for dizziness, seizures and speech difficulty.  Psychiatric/Behavioral: Negative for confusion and hallucinations.  All other systems reviewed and are negative.    Physical Exam Updated Vital Signs BP 153/77 (BP Location: Left Arm)   Pulse 65   Temp 98.6 F (37 C) (Oral)   Resp 16   Ht 5'  4" (1.626 m)   Wt 72.6 kg   SpO2 97%   BMI 27.46 kg/m   Physical Exam  Constitutional: She is oriented to person, place, and time. She appears well-developed and well-nourished.  Non-toxic appearance.  HENT:  Head: Normocephalic.  Right Ear: Tympanic membrane and external ear normal.  Left Ear: Tympanic membrane and external ear normal.  Eyes: EOM and lids are normal. Pupils are equal, round, and reactive to light.  Neck: Normal range of motion. Neck supple. Carotid bruit is not present.  Cardiovascular: Normal rate, regular rhythm, normal heart sounds, intact distal pulses and normal pulses.   Pulmonary/Chest: Breath sounds normal. No respiratory distress.  Abdominal: Soft. Bowel sounds are normal. There is no tenderness. There is no  guarding.  Musculoskeletal: Normal range of motion.       Right elbow: Tenderness found.  Small red raised area noted of the right antecubital. There no red streaks appreciated. No drainage noted. Is full range of motion of the right shoulder, elbow, wrist, and fingers. Capillary refill is less than 2 seconds.  Lymphadenopathy:       Head (right side): No submandibular adenopathy present.       Head (left side): No submandibular adenopathy present.    She has no cervical adenopathy.  Neurological: She is alert and oriented to person, place, and time. She has normal strength. No cranial nerve deficit or sensory deficit.  Skin: Skin is warm and dry.  Psychiatric: She has a normal mood and affect. Her speech is normal.  Nursing note and vitals reviewed.    ED Treatments / Results  Labs (all labs ordered are listed, but only abnormal results are displayed) Labs Reviewed - No data to display  EKG  EKG Interpretation None       Radiology No results found.  Procedures Procedures (including critical care time)  Medications Ordered in ED Medications  doxycycline (VIBRA-TABS) tablet 100 mg (not administered)  ibuprofen (ADVIL,MOTRIN) tablet 800 mg (not administered)  ondansetron (ZOFRAN) tablet 4 mg (not administered)     Initial Impression / Assessment and Plan / ED Course  I have reviewed the triage vital signs and the nursing notes.  Pertinent labs & imaging results that were available during my care of the patient were reviewed by me and considered in my medical decision making (see chart for details).  Clinical Course     **I have reviewed nursing notes, vital signs, and all appropriate lab and imaging results for this patient.*  Final Clinical Impressions(s) / ED Diagnoses  Vital signs within normal limits. Pulse oximetry is 97% on room air. There is a red raised bump on the antecubital area of the right upper extremity. There no red streaks noted. The patient will be  treated with doxycycline 2 times daily. Patient is to continue her current pain medications. I've also asked the patient to use warm compresses 2 or 3 times daily. Patient is in agreement with this plan.    Final diagnoses:  Insect bite, initial encounter    New Prescriptions New Prescriptions   No medications on file     Ivery QualeHobson Marjean Imperato, PA-C 12/17/15 1350    Bethann BerkshireJoseph Zammit, MD 12/17/15 1444

## 2015-12-17 NOTE — ED Triage Notes (Signed)
PT states on 12/13/15 she was playing on her phone and felt a stingy/burning sensation to right inner elbow and has a red raised area appear. PT denies any drainage from the area.

## 2016-07-05 ENCOUNTER — Emergency Department (HOSPITAL_COMMUNITY)
Admission: EM | Admit: 2016-07-05 | Discharge: 2016-07-05 | Disposition: A | Discharge status: Home / Self Care | Payer: Medicaid Other | Attending: Emergency Medicine | Admitting: Emergency Medicine

## 2016-07-05 ENCOUNTER — Encounter (HOSPITAL_COMMUNITY): Payer: Self-pay | Admitting: Emergency Medicine

## 2016-07-05 ENCOUNTER — Emergency Department (HOSPITAL_COMMUNITY): Payer: Medicaid Other

## 2016-07-05 DIAGNOSIS — F17210 Nicotine dependence, cigarettes, uncomplicated: Secondary | ICD-10-CM | POA: Insufficient documentation

## 2016-07-05 DIAGNOSIS — Z79899 Other long term (current) drug therapy: Secondary | ICD-10-CM | POA: Insufficient documentation

## 2016-07-05 DIAGNOSIS — J40 Bronchitis, not specified as acute or chronic: Secondary | ICD-10-CM | POA: Insufficient documentation

## 2016-07-05 MED ORDER — DOXYCYCLINE HYCLATE 100 MG PO CAPS: 100.0000 mg | ORAL_CAPSULE | Freq: Two times a day (BID) | ORAL | 0 refills | Status: DC

## 2016-07-05 MED ORDER — ALBUTEROL SULFATE HFA 108 (90 BASE) MCG/ACT IN AERS
2.0000 | INHALATION_SPRAY | Freq: Once | RESPIRATORY_TRACT | Status: AC
  Administered 2016-07-05: 2 via RESPIRATORY_TRACT
  Filled 2016-07-05: qty 6.7

## 2016-07-05 MED ORDER — IPRATROPIUM BROMIDE 0.02 % IN SOLN: 0.5000 mg | Freq: Once | RESPIRATORY_TRACT | Status: DC

## 2016-07-05 MED ORDER — ALBUTEROL SULFATE (2.5 MG/3ML) 0.083% IN NEBU: 2.5000 mg | INHALATION_SOLUTION | Freq: Once | RESPIRATORY_TRACT | Status: DC

## 2016-07-05 MED ORDER — IPRATROPIUM-ALBUTEROL 0.5-2.5 (3) MG/3ML IN SOLN
3.0000 mL | Freq: Once | RESPIRATORY_TRACT | Status: AC
  Administered 2016-07-05: 3 mL via RESPIRATORY_TRACT
  Filled 2016-07-05: qty 3

## 2016-07-05 MED ORDER — PREDNISONE 20 MG PO TABS
40.0000 mg | ORAL_TABLET | Freq: Once | ORAL | Status: AC
  Administered 2016-07-05: 40 mg via ORAL
  Filled 2016-07-05: qty 2

## 2016-07-05 MED ORDER — DOXYCYCLINE HYCLATE 100 MG PO TABS
100.0000 mg | ORAL_TABLET | Freq: Once | ORAL | Status: AC
  Administered 2016-07-05: 100 mg via ORAL
  Filled 2016-07-05: qty 1

## 2016-07-05 MED ORDER — HYDROCOD POLST-CPM POLST ER 10-8 MG/5ML PO SUER
5.0000 mL | Freq: Once | ORAL | Status: AC
  Administered 2016-07-05: 5 mL via ORAL
  Filled 2016-07-05: qty 5

## 2016-07-05 MED ORDER — HYDROCODONE-HOMATROPINE 5-1.5 MG/5ML PO SYRP: 5.0000 mL | ORAL_SOLUTION | Freq: Four times a day (QID) | ORAL | 0 refills | Status: DC | PRN

## 2016-07-05 MED ORDER — DEXAMETHASONE 4 MG PO TABS: 4.0000 mg | ORAL_TABLET | Freq: Two times a day (BID) | ORAL | 0 refills | Status: DC

## 2016-07-05 NOTE — ED Triage Notes (Signed)
Cough, chest pressure and difficulty taking a deep breath since wed

## 2016-07-05 NOTE — Discharge Instructions (Signed)
Your vital signs within normal limits. Your chest x-ray suggest bronchitis. Please use albuterol 2 puffs every 4 hours. Use Decadron and doxycycline 2 times daily with food. May use Hycodan for cough every 6 hours. This medication may cause drowsiness, please use it with caution. Please see Dr. Fran LowesHolder in the office as soon as possible for follow-up and recheck. Please return to the emergency department immediately if any changes, problems, or concerns.

## 2016-07-05 NOTE — ED Provider Notes (Signed)
[facility] Provider Note   CSN: [id number] Arrival date & time: 07/05/16  2154     History   Chief Complaint Chief Complaint  Patient presents with  . Cough    HPI Leslie Bowers is a 46 y.o. female.  Patient is a 46 year old female who presents to the emergency department with complaint of cough and congestion.  Patient gives a four-day history of increasing cough. This is accompanied by a pressure sensation in her chest is aggravated by the cough. She has not had any high fever reported. There's been no hemoptysis reported. No recent injury or trauma to the chest. No history of lung related illness. It is of note that the patient is a smoker. She feels as though she has difficulty when she is taking a deep breath. Also a deep breath causes her to do a very deep cough. She presents now for assistance with this issue. Patient has not taken any medication with exception of over-the-counter cough medicines.    The history is provided by the patient.  Cough  Associated symptoms include shortness of breath and wheezing. Pertinent negatives include no chest pain, no chills, no ear pain, no rhinorrhea and no myalgias.    Past Medical History:  Diagnosis Date  . Chronic back pain     There are no active problems to display for this patient.   Past Surgical History:  Procedure Laterality Date  . ABDOMINAL HYSTERECTOMY    . CESAREAN SECTION    . GASTRIC BYPASS      OB History    Gravida Para Term Preterm AB Living   3         3   SAB TAB Ectopic Multiple Live Births                   Home Medications    Prior to Admission medications   Medication Sig Start Date End Date Taking? Authorizing Provider  guaiFENesin (ROBITUSSIN) 100 MG/5ML SOLN Take 5 mLs by mouth every 4 (four) hours as needed for cough or to loosen phlegm.   Yes [provider]  morphine (MS CONTIN) 15 MG 12 hr tablet Take 15 mg by mouth daily.   Yes [provider]    tiZANidine (ZANAFLEX) 4 MG capsule Take 4 mg by mouth every 8 (eight) hours as needed for muscle spasms.  06/07/14  Yes [provider]  traMADol (ULTRAM) 50 MG tablet Take 1 tablet (50 mg total) by mouth every 6 (six) hours as needed. Patient taking differently: Take 50 mg by mouth every 6 (six) hours as needed for moderate pain or severe pain.  03/30/15  Yes Donnetta Hutchingook, Brian, MD  dexamethasone (DECADRON) 4 MG tablet Take 1 tablet (4 mg total) by mouth 2 (two) times daily with a meal. 07/05/16   Ivery QualeBryant, Dewitt Judice, PA-C  doxycycline (VIBRAMYCIN) 100 MG capsule Take 1 capsule (100 mg total) by mouth 2 (two) times daily. 07/05/16   Ivery QualeBryant, Kadey Mihalic, PA-C  HYDROcodone-homatropine [facility](HYCODAN) 5-1.5 MG/5ML syrup Take 5 mLs by mouth every 6 (six) hours as needed. 07/05/16   Ivery QualeBryant, Pattiann Solanki, PA-C    Family History No family history on file.  Social History Social History  Substance Use Topics  . Smoking status: Current Every Day Smoker    Packs/day: 1.00    Types: Cigarettes  . Smokeless tobacco: Never Used  . Alcohol use No     Allergies   Dilaudid [hydromorphone hcl] and Gabapentin   Review of Systems Review of  Systems  Constitutional: Positive for activity change. Negative for appetite change, chills, diaphoresis and fever.  HENT: Negative for congestion, ear discharge, ear pain, facial swelling, nosebleeds, rhinorrhea, sneezing and tinnitus.   Eyes: Negative for photophobia, pain and discharge.  Respiratory: Positive for cough, shortness of breath and wheezing. Negative for choking.   Cardiovascular: Negative for chest pain, palpitations and leg swelling.  Gastrointestinal: Negative for abdominal pain, blood in stool, constipation, diarrhea, nausea and vomiting.  Genitourinary: Negative for difficulty urinating, dysuria, flank pain, frequency and hematuria.  Musculoskeletal: Negative for back pain, gait problem, myalgias and neck pain.  Skin: Negative for color change, rash and wound.   Neurological: Negative for dizziness, seizures, syncope, facial asymmetry, speech difficulty, weakness and numbness.  Hematological: Negative for adenopathy. Does not bruise/bleed easily.  Psychiatric/Behavioral: Negative for agitation, confusion, hallucinations, self-injury and suicidal ideas. The patient is not nervous/anxious.      Physical Exam Updated Vital Signs BP 121/67   Pulse 87   Temp 98.6 F (37 C) (Oral)   Resp (!) 22   Ht 5\' 4"  (1.626 m)   Wt 68 kg (150 lb)   SpO2 94%   BMI 25.75 kg/m   Physical Exam  Constitutional: Vital signs are normal. She appears well-developed and well-nourished. She is active.  HENT:  Head: Normocephalic and atraumatic.  Right Ear: Tympanic membrane, external ear and ear canal normal.  Left Ear: Tympanic membrane, external ear and ear canal normal.  Nose: Nose normal.  Mouth/Throat: Uvula is midline, oropharynx is clear and moist and mucous membranes are normal.  Eyes: Conjunctivae, EOM and lids are normal. Pupils are equal, round, and reactive to light.  Neck: Trachea normal, normal range of motion and phonation normal. Neck supple. Carotid bruit is not present.  Cardiovascular: Normal rate, regular rhythm and normal pulses.   Pulmonary/Chest: She has wheezes. She exhibits tenderness.  Abdominal: Soft. Normal appearance and bowel sounds are normal.  Musculoskeletal: She exhibits no edema.  Lymphadenopathy:       Head (right side): No submental, no preauricular and no posterior auricular adenopathy present.       Head (left side): No submental, no preauricular and no posterior auricular adenopathy present.    She has no cervical adenopathy.  Neurological: She is alert. She has normal strength. No cranial nerve deficit or sensory deficit. GCS eye subscore is 4. GCS verbal subscore is 5. GCS motor subscore is 6.  Skin: Skin is warm and dry.  Psychiatric: Her speech is normal.     ED Treatments / Results  Labs (all labs ordered are  listed, but only abnormal results are displayed) Labs Reviewed - No data to display  EKG  EKG Interpretation None       Radiology Dg Chest 2 View  Result Date: 07/05/2016 CLINICAL DATA:  Nonproductive cough, chest pressure and difficulty breathing for 4 days. EXAM: CHEST  2 VIEW COMPARISON:  CT chest April 22, 2015 FINDINGS: Cardiomediastinal silhouette is normal. No pleural effusions or focal consolidations. Mild bronchitic changes. Trachea projects midline and there is no pneumothorax. Soft tissue planes and included osseous structures are non-suspicious. Calcifications projecting at humeral heads most compatible with supraspinatus calcific tendinopathy. Surgical clips LEFT abdomen. IMPRESSION: Mild bronchitic changes without focal consolidation. Electronically Signed   By: Awilda Metro M.D.   On: 07/05/2016 22:46    Procedures Procedures (including critical care time)  Medications Ordered in ED Medications  chlorpheniramine-HYDROcodone (TUSSIONEX) 10-8 MG/5ML suspension 5 mL (5 mLs Oral Given 07/05/16 2224)  predniSONE (DELTASONE) tablet 40 mg (40 mg Oral Given 07/05/16 2223)  ipratropium-albuterol (DUONEB) 0.5-2.5 (3) MG/3ML nebulizer solution 3 mL (3 mLs Nebulization Given 07/05/16 2241)  doxycycline (VIBRA-TABS) tablet 100 mg (100 mg Oral Given 07/05/16 2307)  albuterol (PROVENTIL HFA;VENTOLIN HFA) 108 (90 Base) MCG/ACT inhaler 2 puff (2 puffs Inhalation Given 07/05/16 2314)     Initial Impression / Assessment and Plan / ED Course  I have reviewed the triage vital signs and the nursing notes.  Pertinent labs & imaging results that were available during my care of the patient were reviewed by me and considered in my medical decision making (see chart for details).       Final Clinical Impressions(s) / ED Diagnoses MDM Vital signs within normal limits. Pulse oximetry is 90-94% on room air. Within normal limits by my interpretation. The chest x-ray shows evidence of  bronchitis, otherwise normal.  The patient was treated in the emergency department with Tussionex, duoNeb, and steroid. The patient states she feels some better after these medications. After the x-ray results were reviewed. Patient was started on doxycycline and she is a smoker.  Prescription for Hycodan, doxycycline, and Decadron given to the patient. The patient will follow-up with Dr. Fran Lowes for additional evaluation and management as an outpatient. The patient will return to the emergency department if any changes, problems, or concerns.    Final diagnoses:  Bronchitis    New Prescriptions New Prescriptions   DEXAMETHASONE (DECADRON) 4 MG TABLET    Take 1 tablet (4 mg total) by mouth 2 (two) times daily with a meal.   DOXYCYCLINE (VIBRAMYCIN) 100 MG CAPSULE    Take 1 capsule (100 mg total) by mouth 2 (two) times daily.   HYDROCODONE-HOMATROPINE (HYCODAN) 5-1.5 MG/5ML SYRUP    Take 5 mLs by mouth every 6 (six) hours as needed.     Ivery Quale, PA-C 07/05/16 1610    Donnetta Hutching, MD 07/12/16 [phone]

## 2016-07-05 NOTE — ED Notes (Signed)
Pt alert & oriented x4, stable gait. Patient given discharge instructions, paperwork & prescription(s). Patient informed not to drive, operate any equipment & handel any important documents 4 hours after taking pain medication. Patient  instructed to stop at the registration desk to finish any additional paperwork. Patient  verbalized understanding. Pt left department w/ no further questions. 

## 2016-11-28 ENCOUNTER — Emergency Department (HOSPITAL_COMMUNITY): Payer: Self-pay

## 2016-11-28 ENCOUNTER — Encounter (HOSPITAL_COMMUNITY): Payer: Self-pay | Admitting: Emergency Medicine

## 2016-11-28 ENCOUNTER — Emergency Department (HOSPITAL_COMMUNITY)
Admission: EM | Admit: 2016-11-28 | Discharge: 2016-11-28 | Disposition: A | Discharge status: Home Health | Payer: Self-pay | Attending: Emergency Medicine | Admitting: Emergency Medicine

## 2016-11-28 DIAGNOSIS — W19XXXA Unspecified fall, initial encounter: Secondary | ICD-10-CM

## 2016-11-28 DIAGNOSIS — Y939 Activity, unspecified: Secondary | ICD-10-CM | POA: Insufficient documentation

## 2016-11-28 DIAGNOSIS — Z79899 Other long term (current) drug therapy: Secondary | ICD-10-CM | POA: Insufficient documentation

## 2016-11-28 DIAGNOSIS — W010XXA Fall on same level from slipping, tripping and stumbling without subsequent striking against object, initial encounter: Secondary | ICD-10-CM | POA: Insufficient documentation

## 2016-11-28 DIAGNOSIS — Y92009 Unspecified place in unspecified non-institutional (private) residence as the place of occurrence of the external cause: Secondary | ICD-10-CM

## 2016-11-28 DIAGNOSIS — M25522 Pain in left elbow: Secondary | ICD-10-CM | POA: Insufficient documentation

## 2016-11-28 DIAGNOSIS — Y9289 Other specified places as the place of occurrence of the external cause: Secondary | ICD-10-CM | POA: Insufficient documentation

## 2016-11-28 DIAGNOSIS — Y998 Other external cause status: Secondary | ICD-10-CM | POA: Insufficient documentation

## 2016-11-28 DIAGNOSIS — F17210 Nicotine dependence, cigarettes, uncomplicated: Secondary | ICD-10-CM | POA: Insufficient documentation

## 2016-11-28 MED ORDER — KETOROLAC TROMETHAMINE 60 MG/2ML IM SOLN
60.0000 mg | Freq: Once | INTRAMUSCULAR | Status: AC
  Administered 2016-11-28: 60 mg via INTRAMUSCULAR
  Filled 2016-11-28: qty 2

## 2016-11-28 MED ORDER — PREDNISONE 10 MG PO TABS: ORAL_TABLET | ORAL | 0 refills | Status: DC

## 2016-11-28 NOTE — Discharge Instructions (Signed)
Your xrays are negative for fracture or dislocation.  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  Apply ice as much as is comfortable for the next several days, adding a heating pad for 20 minutes 3 times daily starting on Tuesday. Wear the sling for comfort. The ace wrap may also help with pain relief.

## 2016-11-28 NOTE — ED Provider Notes (Signed)
[facility] Provider Note   CSN: [id number] Arrival date & time: 11/28/16  1308     History   Chief Complaint Chief Complaint  Patient presents with  . Fall    HPI Anette GuarneriKathryn Combes is a 46 y.o. female presenting with left elbow pain associated with fall.  She slipped going down a wet stoop prior to arrival landing directly on her flexed left elbow.  She has pain from her mid humerus through her elbow, denies forearm wrist or hand pain.  She denies weakness or swelling distal to the injury site. She has had no medications or treatment prior to arrival.  She denies head or neck injury and has no other complaints.  HPI  Past Medical History:  Diagnosis Date  . Chronic back pain     There are no active problems to display for this patient.   Past Surgical History:  Procedure Laterality Date  . ABDOMINAL HYSTERECTOMY    . CESAREAN SECTION    . GASTRIC BYPASS      OB History    Gravida Para Term Preterm AB Living   3         3   SAB TAB Ectopic Multiple Live Births                   Home Medications    Prior to Admission medications   Medication Sig Start Date End Date Taking? Authorizing Provider  morphine (MS CONTIN) 15 MG 12 hr tablet Take 15 mg by mouth daily.   Yes [provider]  tiZANidine (ZANAFLEX) 4 MG capsule Take 4 mg by mouth every 8 (eight) hours as needed for muscle spasms.  06/07/14  Yes [provider]  traMADol (ULTRAM) 50 MG tablet Take 1 tablet (50 mg total) by mouth every 6 (six) hours as needed. Patient taking differently: Take 50 mg by mouth every 6 (six) hours as needed for moderate pain or severe pain.  03/30/15  Yes Donnetta Hutchingook, Brian, MD  predniSONE (DELTASONE) 10 MG tablet Take 6 tablets day one, 5 tablets day two, 4 tablets day three, 3 tablets day four, 2 tablets day five, then 1 tablet day six 11/28/16   Burgess AmorIdol, Frances Joynt, PA-C    Family History History reviewed. No pertinent family history.  Social  History Social History  Substance Use Topics  . Smoking status: Current Every Day Smoker    Packs/day: 1.00    Types: Cigarettes  . Smokeless tobacco: Never Used  . Alcohol use No     Allergies   Dilaudid [hydromorphone hcl] and Gabapentin   Review of Systems Review of Systems  Constitutional: Negative for fever.  Musculoskeletal: Positive for arthralgias. Negative for joint swelling and myalgias.  Neurological: Negative for weakness and numbness.     Physical Exam Updated Vital Signs BP (!) 159/95 (BP Location: Right Arm)   Pulse 61   Temp 97.9 F (36.6 C) (Oral)   Resp 16   Ht 5\' 4"  (1.626 m)   Wt 68 kg (150 lb)   SpO2 100%   BMI 25.75 kg/m   Physical Exam  Constitutional: She appears well-developed and well-nourished.  HENT:  Head: Atraumatic.  Neck: Normal range of motion.  Cardiovascular:  Pulses equal bilaterally  Musculoskeletal: She exhibits tenderness.       Left elbow: She exhibits normal range of motion, no swelling, no effusion and no deformity. Tenderness found. Olecranon process tenderness noted.  Neurological: She is alert. She has normal strength. She  displays normal reflexes. No sensory deficit.  Skin: Skin is warm and dry.  Psychiatric: She has a normal mood and affect.     ED Treatments / Results  Labs (all labs ordered are listed, but only abnormal results are displayed) Labs Reviewed - No data to display  EKG  EKG Interpretation None       Radiology Dg Elbow Complete Left  Result Date: 11/28/2016 CLINICAL DATA:  Pain after trauma. EXAM: LEFT ELBOW - COMPLETE 3+ VIEW COMPARISON:  None. FINDINGS: There is no evidence of fracture, dislocation, or joint effusion. There is no evidence of arthropathy or other focal bone abnormality. Soft tissues are unremarkable. IMPRESSION: Negative. Electronically Signed   By: Gerome Sam III M.D   On: 11/28/2016 14:49   Dg Shoulder Left  Result Date: 11/28/2016 CLINICAL DATA:  Larey Seat on  steps. Left shoulder pain. Initial encounter. EXAM: LEFT SHOULDER - 2+ VIEW COMPARISON:  04/30/2011 FINDINGS: There is no evidence of fracture or dislocation. There is no evidence of arthropathy or other focal bone abnormality. New soft tissue ossification seen adjacent to the greater tuberosity of the humeral head, at the site of rotator cuff insertion. This is consistent with calcific tendinitis of the rotator cuff. IMPRESSION: No acute osseous abnormality. Soft tissue calcification at site of distal rotator cuff insertion, suspicious for calcific tendinitis. Electronically Signed   By: Myles Rosenthal M.D.   On: 11/28/2016 14:52    Procedures Procedures (including critical care time)  Medications Ordered in ED Medications  ketorolac (TORADOL) injection 60 mg (not administered)     Initial Impression / Assessment and Plan / ED Course  I have reviewed the triage vital signs and the nursing notes.  Pertinent labs & imaging results that were available during my care of the patient were reviewed by me and considered in my medical decision making (see chart for details).     Patient with left elbow pain, probable deep contusion.  Images reviewed, negative.  Discussed with patient.  Rest, ice, sling and Ace wrap provided.  Plan follow-up with her PCP for recheck in one week if symptoms persist.  Patient has chronic pain management through Dr. Gerilyn Pilgrim.  She has taken her tramadol and MS Contin while here.  Toradol injection given here.  Final Clinical Impressions(s) / ED Diagnoses   Final diagnoses:  Fall in home, initial encounter  Elbow pain, left    New Prescriptions New Prescriptions   PREDNISONE (DELTASONE) 10 MG TABLET    Take 6 tablets day one, 5 tablets day two, 4 tablets day three, 3 tablets day four, 2 tablets day five, then 1 tablet day six     Victoriano Lain 11/28/16 1515    Azalia Bilis, MD 11/28/16 1600

## 2016-11-28 NOTE — ED Triage Notes (Signed)
Pt reports slipping on wet, painted steps and has severe left elbow pain.

## 2017-03-17 ENCOUNTER — Ambulatory Visit: Payer: Self-pay | Attending: Oncology

## 2017-03-24 ENCOUNTER — Emergency Department (HOSPITAL_COMMUNITY)
Admission: EM | Admit: 2017-03-24 | Discharge: 2017-03-24 | Disposition: A | Discharge status: Home / Self Care | Payer: Self-pay

## 2017-03-24 NOTE — ED Triage Notes (Signed)
Per registration pt left

## 2017-03-24 NOTE — ED Triage Notes (Signed)
Called name .  No answer  

## 2017-04-04 ENCOUNTER — Emergency Department (HOSPITAL_COMMUNITY)
Admission: EM | Admit: 2017-04-04 | Discharge: 2017-04-04 | Disposition: A | Discharge status: Home / Self Care | Payer: Self-pay | Attending: Emergency Medicine | Admitting: Emergency Medicine

## 2017-04-04 ENCOUNTER — Encounter (HOSPITAL_COMMUNITY): Payer: Self-pay | Admitting: Emergency Medicine

## 2017-04-04 ENCOUNTER — Emergency Department (HOSPITAL_COMMUNITY): Payer: Self-pay

## 2017-04-04 DIAGNOSIS — Z79899 Other long term (current) drug therapy: Secondary | ICD-10-CM | POA: Insufficient documentation

## 2017-04-04 DIAGNOSIS — J189 Pneumonia, unspecified organism: Secondary | ICD-10-CM | POA: Insufficient documentation

## 2017-04-04 DIAGNOSIS — F17210 Nicotine dependence, cigarettes, uncomplicated: Secondary | ICD-10-CM | POA: Insufficient documentation

## 2017-04-04 DIAGNOSIS — G43009 Migraine without aura, not intractable, without status migrainosus: Secondary | ICD-10-CM | POA: Insufficient documentation

## 2017-04-04 HISTORY — DX: Lyme disease, unspecified: A69.20

## 2017-04-04 MED ORDER — AMOXICILLIN 500 MG PO CAPS: 500.0000 mg | ORAL_CAPSULE | Freq: Three times a day (TID) | ORAL | 0 refills | Status: DC

## 2017-04-04 MED ORDER — AZITHROMYCIN 250 MG PO TABS: 250.0000 mg | ORAL_TABLET | Freq: Every day | ORAL | 0 refills | Status: DC

## 2017-04-04 MED ORDER — DEXAMETHASONE SODIUM PHOSPHATE 10 MG/ML IJ SOLN
10.0000 mg | Freq: Once | INTRAMUSCULAR | Status: AC
  Administered 2017-04-04: 10 mg via INTRAVENOUS
  Filled 2017-04-04: qty 1

## 2017-04-04 MED ORDER — SODIUM CHLORIDE 0.9 % IV BOLUS (SEPSIS)
1000.0000 mL | Freq: Once | INTRAVENOUS | Status: AC
  Administered 2017-04-04: 1000 mL via INTRAVENOUS

## 2017-04-04 MED ORDER — SODIUM CHLORIDE 0.9 % IV SOLN
1.0000 g | Freq: Once | INTRAVENOUS | Status: AC
  Administered 2017-04-04: 1 g via INTRAVENOUS
  Filled 2017-04-04: qty 10

## 2017-04-04 MED ORDER — DIPHENHYDRAMINE HCL 50 MG/ML IJ SOLN
25.0000 mg | Freq: Once | INTRAMUSCULAR | Status: AC
  Administered 2017-04-04: 25 mg via INTRAVENOUS
  Filled 2017-04-04: qty 1

## 2017-04-04 MED ORDER — AZITHROMYCIN 250 MG PO TABS
500.0000 mg | ORAL_TABLET | Freq: Once | ORAL | Status: AC
  Administered 2017-04-04: 500 mg via ORAL
  Filled 2017-04-04: qty 2

## 2017-04-04 MED ORDER — MAGNESIUM SULFATE 2 GM/50ML IV SOLN
2.0000 g | Freq: Once | INTRAVENOUS | Status: AC
  Administered 2017-04-04: 2 g via INTRAVENOUS
  Filled 2017-04-04: qty 50

## 2017-04-04 MED ORDER — METOCLOPRAMIDE HCL 5 MG/ML IJ SOLN
10.0000 mg | Freq: Once | INTRAMUSCULAR | Status: AC
  Administered 2017-04-04: 10 mg via INTRAVENOUS
  Filled 2017-04-04: qty 2

## 2017-04-04 NOTE — ED Provider Notes (Signed)
[facility] Provider Note   CSN: [id number] Arrival date & time: 04/04/17  0231  Time seen 03:17 AM   History   Chief Complaint Chief Complaint  Patient presents with  . Migraine    HPI Leslie Bowers is a 47 y.o. female.  HPI   patient states she has a history of migraine headaches and they normally only last 1-2 days and are relieved with taking over-the-counter migraine medication.  She states they are usually on one side of her head and throbbing.  1 week ago she started getting mild headache mainly on her left side in the forehead and left side of her head.  She took over-the-counter migraine medication and it seemed to help.  She initially told me that the headache went away however then she told me the headache has been there constantly.  She then told me the headaches have been coming and going.  Yesterday, February 23 the headache started getting worse and more persistent.  Again it is left-sided, she has nausea but no vomiting that started just prior to arrival to the ED.  She denies any visual changes.  She states she has numbness and tingling in both of her hands.  She states bending over or coughing, bright lights or loud noises make the headache worse, nothing has made it better.  She states this headache is different only in that it is lasting longer than usual.  She also states the numbness and tingling is new.  Patient states she has had the usual smoker's cough but her cough is been getting deeper over the past week.  She denies fever.  PCP Denita LungHolder, Latia, DO   Past Medical History:  Diagnosis Date  . Chronic back pain   . Lyme disease     There are no active problems to display for this patient.   Past Surgical History:  Procedure Laterality Date  . ABDOMINAL HYSTERECTOMY    . CESAREAN SECTION    . GASTRIC BYPASS      OB History    Gravida Para Term Preterm AB Living   3         3   SAB TAB Ectopic Multiple Live Births          Home Medications    Prior to Admission medications   Medication Sig Start Date End Date Taking? Authorizing Provider  morphine (MS CONTIN) 15 MG 12 hr tablet Take 15 mg by mouth daily.   Yes [provider]  tiZANidine (ZANAFLEX) 4 MG capsule Take 4 mg by mouth every 8 (eight) hours as needed for muscle spasms.  06/07/14  Yes [provider]  traMADol (ULTRAM) 50 MG tablet Take 1 tablet (50 mg total) by mouth every 6 (six) hours as needed. Patient taking differently: Take 50 mg by mouth every 6 (six) hours as needed for moderate pain or severe pain.  03/30/15  Yes Donnetta Hutchingook, Brian, MD  amoxicillin (AMOXIL) 500 MG capsule Take 1 capsule (500 mg total) by mouth 3 (three) times daily. 04/04/17   Devoria AlbeKnapp, Vasily Fedewa, MD  azithromycin (ZITHROMAX) 250 MG tablet Take 1 tablet (250 mg total) by mouth daily. 04/04/17   Devoria AlbeKnapp, Brina Umeda, MD  predniSONE (DELTASONE) 10 MG tablet Take 6 tablets day one, 5 tablets day two, 4 tablets day three, 3 tablets day four, 2 tablets day five, then 1 tablet day six 11/28/16   Burgess AmorIdol, Julie, PA-C    Family History No family history on file.  Social History Social  History   Tobacco Use  . Smoking status: Current Every Day Smoker    Packs/day: 1.00    Types: Cigarettes  . Smokeless tobacco: Never Used  Substance Use Topics  . Alcohol use: No  . Drug use: No     Allergies   Dilaudid [hydromorphone hcl] and Gabapentin   Review of Systems Review of Systems  All other systems reviewed and are negative.    Physical Exam Updated Vital Signs BP 103/62   Pulse (!) 108   Temp (!) 103.1 F (39.5 C)   Resp 18   Ht 5\' 4"  (1.626 m)   Wt 77.1 kg (170 lb)   SpO2 91%   BMI 29.18 kg/m   Vital signs normal except for tachycardia and fever   Physical Exam  Constitutional: She is oriented to person, place, and time. She appears well-developed and well-nourished.  Non-toxic appearance. She does not appear ill. No distress.  Appears uncomfortable     HENT:  Head: Normocephalic and atraumatic.  Right Ear: External ear normal.  Left Ear: External ear normal.  Nose: Nose normal. No mucosal edema or rhinorrhea.  Mouth/Throat: Oropharynx is clear and moist and mucous membranes are normal. No dental abscesses or uvula swelling.  Poor dentition  Eyes: Conjunctivae and EOM are normal. Pupils are equal, round, and reactive to light.  Neck: Normal range of motion and full passive range of motion without pain. Neck supple.  Cardiovascular: Normal rate, regular rhythm and normal heart sounds. Exam reveals no gallop and no friction rub.  No murmur heard. Pulmonary/Chest: Effort normal and breath sounds normal. No respiratory distress. She has no wheezes. She has no rhonchi. She has no rales. She exhibits no tenderness and no crepitus.  Abdominal: Soft. Normal appearance and bowel sounds are normal. She exhibits no distension. There is no tenderness. There is no rebound and no guarding.  Musculoskeletal: Normal range of motion. She exhibits no edema or tenderness.  Moves all extremities well.   Neurological: She is alert and oriented to person, place, and time. She has normal strength. No cranial nerve deficit.  Patient is right-handed, grips are equal, there is no pronator drift, there is no lower motor weakness.  When I do light touch she states it feels different on both hands compared to her forearms.  Skin: Skin is warm, dry and intact. No rash noted. No erythema. No pallor.  Psychiatric: She has a normal mood and affect. Her speech is normal and behavior is normal. Her mood appears not anxious.  Nursing note and vitals reviewed.    ED Treatments / Results  Labs (all labs ordered are listed, but only abnormal results are displayed) Labs Reviewed - No data to display  EKG  EKG Interpretation None       Radiology Dg Chest 2 View  Result Date: 04/04/2017 CLINICAL DATA:  Headache.  Nausea. EXAM: CHEST  2 VIEW COMPARISON:  Jul 05, 2016  FINDINGS: No pneumothorax. The heart, hila, and mediastinum are normal. No suspicious nodules or masses. Mild infiltrate in the left base/lingula. IMPRESSION: New mild infiltrate left base and lingula. Recommend follow-up to resolution. Electronically Signed   By: Gerome Sam III M.D   On: 04/04/2017 07:01    Procedures Procedures (including critical care time)  Medications Ordered in ED Medications  cefTRIAXone (ROCEPHIN) 1 g in sodium chloride 0.9 % 100 mL IVPB (not administered)  azithromycin (ZITHROMAX) tablet 500 mg (not administered)  sodium chloride 0.9 % bolus 1,000 mL (0 mLs  Intravenous Stopped 04/04/17 0542)  metoCLOPramide (REGLAN) injection 10 mg (10 mg Intravenous Given 04/04/17 0344)  diphenhydrAMINE (BENADRYL) injection 25 mg (25 mg Intravenous Given 04/04/17 0343)  dexamethasone (DECADRON) injection 10 mg (10 mg Intravenous Given 04/04/17 0345)  sodium chloride 0.9 % bolus 1,000 mL (0 mLs Intravenous Stopped 04/04/17 0428)  magnesium sulfate IVPB 2 g 50 mL (0 g Intravenous Stopped 04/04/17 0649)     Initial Impression / Assessment and Plan / ED Course  I have reviewed the triage vital signs and the nursing notes.  Pertinent labs & imaging results that were available during my care of the patient were reviewed by me and considered in my medical decision making (see chart for details).    Patient was given IV fluids and started on migraine cocktail.  At this point CT of the head was not done, although the numbness and tingling in her hands is new it is bilateral which would be very unusual for an acute intracranial event.  We will see how she does with the medications and decide how to proceed from there.  Recheck at 5:25 AM patient states her headache is much improved however she would appreciate some more medication.  She was given IV magnesium.  Patient's fever improved just with IV fluids and IV steroids.  Chest x-ray was done due to her fever and worsening cough.   Patient does have a infiltrate in the left base and lingula.  She was started on IV Rocephin and oral Zithromax.  She will be discharged on amoxicillin and Z-Pak.  Recheck at 7:10 AM patient was informed about her chest x-ray result.  She states her headache is much improved.  She feels like she can go home now and  just take her over-the-counter migraine headache medications.  Final Clinical Impressions(s) / ED Diagnoses   Final diagnoses:  Migraine without aura and without status migrainosus, not intractable  Community acquired pneumonia of left lung, unspecified part of lung    ED Discharge Orders        Ordered    azithromycin (ZITHROMAX) 250 MG tablet  Daily     04/04/17 0741    amoxicillin (AMOXIL) 500 MG capsule  3 times daily     04/04/17 0741    OTC ibuprofen and acetaminophen  Plan discharge  Devoria Albe, MD, Concha Pyo, MD 04/04/17 [phone]

## 2017-04-04 NOTE — ED Notes (Signed)
Pt ambulatory to bathroom and back to room assisted by significant other

## 2017-04-04 NOTE — ED Triage Notes (Signed)
Headache x1 week with nausea

## 2017-04-04 NOTE — Discharge Instructions (Signed)
Drink plenty of fluids. Take ibuprofen 600 mg and/or acetaminophen 1000 mg every 6 hrs for fever as needed. Take the antibiotics until gone. Let your doctor know you had a pneumonia, you should be rechecked in about 3 weeks unless you are getting worse.

## 2017-04-04 NOTE — ED Notes (Signed)
Pt thinks she may be having a "flare up of her Lyme Disease".

## 2017-04-22 ENCOUNTER — Emergency Department (HOSPITAL_COMMUNITY): Payer: Self-pay

## 2017-04-22 ENCOUNTER — Other Ambulatory Visit: Payer: Self-pay

## 2017-04-22 ENCOUNTER — Emergency Department (HOSPITAL_COMMUNITY)
Admission: EM | Admit: 2017-04-22 | Discharge: 2017-04-22 | Disposition: A | Discharge status: Home / Self Care | Payer: Self-pay | Attending: Emergency Medicine | Admitting: Emergency Medicine

## 2017-04-22 ENCOUNTER — Encounter (HOSPITAL_COMMUNITY): Payer: Self-pay | Admitting: Emergency Medicine

## 2017-04-22 DIAGNOSIS — F17210 Nicotine dependence, cigarettes, uncomplicated: Secondary | ICD-10-CM | POA: Insufficient documentation

## 2017-04-22 DIAGNOSIS — M545 Low back pain: Secondary | ICD-10-CM | POA: Insufficient documentation

## 2017-04-22 DIAGNOSIS — Z9884 Bariatric surgery status: Secondary | ICD-10-CM | POA: Insufficient documentation

## 2017-04-22 DIAGNOSIS — G8929 Other chronic pain: Secondary | ICD-10-CM | POA: Insufficient documentation

## 2017-04-22 MED ORDER — FENTANYL CITRATE (PF) 100 MCG/2ML IJ SOLN
50.0000 ug | Freq: Once | INTRAMUSCULAR | Status: AC
  Administered 2017-04-22: 50 ug via INTRAMUSCULAR
  Filled 2017-04-22: qty 2

## 2017-04-22 NOTE — ED Provider Notes (Signed)
[facility]   CSN: [id number] Arrival date & time: 04/22/17  1214     History   Chief Complaint Chief Complaint  Patient presents with  . Back Pain    HPI Leslie Bowers is a 47 y.o. female.  HPI   Leslie Bowers is a 47 y.o. female who presents to the Emergency Department with worsening of her chronic low back pain.  She states the pain has been exacerbated for several days.  She believes that playing with her grandchildren over the last weekend is made her back pain worse.  She describes an sharp aching pain to the mid lower back.  Pain is associated with movements and excessive standing.  Pain improves in the fetal position and when leaning forward.  She denies any numbness, pain, or weakness of the lower extremities, abdominal pain, fever, chills, urine or bowel incontinence and/or retention.  She states that she normally takes 15 mg morphine tablets and tramadol, but the medication has not improved her pain.  She states that she was seen earlier this week at her pain management provider's office and was told to come here today for pain control.   Past Medical History:  Diagnosis Date  . Chronic back pain   . Lyme disease     There are no active problems to display for this patient.   Past Surgical History:  Procedure Laterality Date  . ABDOMINAL HYSTERECTOMY    . CESAREAN SECTION    . GASTRIC BYPASS      OB History    Gravida Para Term Preterm AB Living   3         3   SAB TAB Ectopic Multiple Live Births                   Home Medications    Prior to Admission medications   Medication Sig Start Date End Date Taking? Authorizing Provider  amoxicillin (AMOXIL) 500 MG capsule Take 1 capsule (500 mg total) by mouth 3 (three) times daily. 04/04/17   Devoria AlbeKnapp, Iva, MD  azithromycin (ZITHROMAX) 250 MG tablet Take 1 tablet (250 mg total) by mouth daily. 04/04/17   Devoria AlbeKnapp, Iva, MD  morphine (MS CONTIN) 15 MG 12 hr tablet Take 15  mg by mouth daily.    [provider]  predniSONE (DELTASONE) 10 MG tablet Take 6 tablets day one, 5 tablets day two, 4 tablets day three, 3 tablets day four, 2 tablets day five, then 1 tablet day six 11/28/16   Idol, Raynelle FanningJulie, PA-C  tiZANidine (ZANAFLEX) 4 MG capsule Take 4 mg by mouth every 8 (eight) hours as needed for muscle spasms.  06/07/14   [provider]  traMADol (ULTRAM) 50 MG tablet Take 1 tablet (50 mg total) by mouth every 6 (six) hours as needed. Patient taking differently: Take 50 mg by mouth every 6 (six) hours as needed for moderate pain or severe pain.  03/30/15   Donnetta Hutchingook, Brian, MD    Family History No family history on file.  Social History Social History   Tobacco Use  . Smoking status: Current Every Day Smoker    Packs/day: 1.00    Types: Cigarettes  . Smokeless tobacco: Never Used  Substance Use Topics  . Alcohol use: No  . Drug use: No     Allergies   Dilaudid [hydromorphone hcl] and Gabapentin   Review of Systems Review of Systems  Constitutional: Negative for fever.  Respiratory: Negative for shortness of  breath.   Gastrointestinal: Negative for abdominal pain, constipation and vomiting.  Genitourinary: Negative for decreased urine volume, difficulty urinating, dysuria, flank pain and hematuria.  Musculoskeletal: Positive for back pain. Negative for joint swelling.  Skin: Negative for rash.  Neurological: Negative for weakness and numbness.  All other systems reviewed and are negative.    Physical Exam Updated Vital Signs BP 137/79 (BP Location: Right Arm)   Pulse 77   Temp 98.9 F (37.2 C) (Oral)   Resp 18   Ht 5\' 4"  (1.626 m)   Wt 77.1 kg (170 lb)   SpO2 99%   BMI 29.18 kg/m   Physical Exam  Constitutional: She is oriented to person, place, and time. She appears well-developed and well-nourished. No distress.  HENT:  Head: Normocephalic and atraumatic.  Neck: Normal range of motion. Neck supple.  Cardiovascular: Normal  rate, regular rhythm and intact distal pulses.  DP pulses are strong and palpable bilaterally  Pulmonary/Chest: Effort normal and breath sounds normal. No respiratory distress.  Abdominal: Soft. She exhibits no distension. There is no tenderness.  Musculoskeletal: She exhibits tenderness. She exhibits no edema.       Lumbar back: She exhibits tenderness and pain. She exhibits normal range of motion, no swelling, no deformity, no laceration and normal pulse.  ttp of the lower midline lumbar spine .  No edema or bony deformity.  Pt has 5/5 strength against resistance of bilateral lower extremities.  Neg SLR bilaterally   Neurological: She is alert and oriented to person, place, and time. She has normal strength. No sensory deficit. She exhibits normal muscle tone. Coordination and gait normal.  Reflex Scores:      Patellar reflexes are 2+ on the right side and 2+ on the left side.      Achilles reflexes are 2+ on the right side and 2+ on the left side. Skin: Skin is warm and dry. Capillary refill takes less than 2 seconds. No rash noted.  Nursing note and vitals reviewed.    ED Treatments / Results  Labs (all labs ordered are listed, but only abnormal results are displayed) Labs Reviewed - No data to display  EKG  EKG Interpretation None       Radiology Dg Lumbar Spine Complete  Result Date: 04/22/2017 CLINICAL DATA:  Chronic low back pain which worsened today. No known injury. EXAM: LUMBAR SPINE - COMPLETE 4+ VIEW COMPARISON:  None. FINDINGS: No fracture or malalignment is identified. Marked loss of disc space height is seen at L4-5 and L5-S1. There is also some facet degenerative disease at these levels. No pars interarticularis defect. Imaged paraspinous structures demonstrate a fairly large colonic stool burden. IMPRESSION: No acute abnormality. Degenerative disc disease L4-5 and L5-S1. Prominent colonic stool burden. Electronically Signed   By: Drusilla Kanner M.D.   On:  04/22/2017 14:25    Procedures Procedures (including critical care time)  Medications Ordered in ED Medications - No data to display   Initial Impression / Assessment and Plan / ED Course  I have reviewed the triage vital signs and the nursing notes.  Pertinent labs & imaging results that were available during my care of the patient were reviewed by me and considered in my medical decision making (see chart for details).     Patient reviewed on [place] narcotic database.  Received #90 morphine 15 mg tablets on 04/03/2017 for 30-day supply.  Patient with likely acute on chronic low back pain.  No focal neuro deficits on exam.  X-ray reassuring other than a large amount of stool present.  Discussed with patient to try over-the-counter therapies for constipation such as enemas or MiraLAX.  Patient currently under care of pain management, agrees to close follow-up.  She appears stable for discharge.  No concerning symptoms for cauda equina or other emergent neurological process.  Final Clinical Impressions(s) / ED Diagnoses   Final diagnoses:  Chronic midline low back pain without sciatica    ED Discharge Orders    None       Pauline Aus, Cordelia Poche 04/22/17 1555    Bethann Berkshire, MD 04/23/17 1254

## 2017-04-22 NOTE — ED Triage Notes (Signed)
Pt c/o of chronic lower back pain that has worsened today. Pt states no relief after taking 15 mg morphine, 250 mg tramadol and trazodone at 730 am today.

## 2017-04-22 NOTE — Discharge Instructions (Signed)
Alternate ice and heat to your back.  Follow-up with your pain management provider for recheck

## 2017-04-24 ENCOUNTER — Emergency Department (HOSPITAL_COMMUNITY)
Admission: EM | Admit: 2017-04-24 | Discharge: 2017-04-24 | Disposition: A | Discharge status: Home / Self Care | Payer: Medicaid Other | Attending: Emergency Medicine | Admitting: Emergency Medicine

## 2017-04-24 ENCOUNTER — Encounter (HOSPITAL_COMMUNITY): Payer: Self-pay | Admitting: Emergency Medicine

## 2017-04-24 DIAGNOSIS — Z5321 Procedure and treatment not carried out due to patient leaving prior to being seen by health care provider: Secondary | ICD-10-CM | POA: Insufficient documentation

## 2017-04-24 DIAGNOSIS — R197 Diarrhea, unspecified: Secondary | ICD-10-CM | POA: Insufficient documentation

## 2017-04-24 NOTE — ED Triage Notes (Signed)
Patient states she ran out of her medication and is having withdrawal symptoms including diarrhea, sneezing, and "body twitching." States she is out of her morphine and tramadol for chronic back problems.

## 2017-04-24 NOTE — ED Notes (Signed)
Called patient x 3. No answer. Patient left after triage but before being placed in a room or seen by physician.

## 2017-04-24 NOTE — ED Notes (Signed)
Called to place in room. No answer. 

## 2019-02-02 ENCOUNTER — Other Ambulatory Visit: Payer: Self-pay

## 2019-02-02 ENCOUNTER — Emergency Department (HOSPITAL_COMMUNITY)
Admission: EM | Admit: 2019-02-02 | Discharge: 2019-02-02 | Disposition: A | Discharge status: Home / Self Care | Payer: Medicaid Other | Attending: Emergency Medicine | Admitting: Emergency Medicine

## 2019-02-02 ENCOUNTER — Encounter (HOSPITAL_COMMUNITY): Payer: Self-pay | Admitting: Emergency Medicine

## 2019-02-02 DIAGNOSIS — N739 Female pelvic inflammatory disease, unspecified: Secondary | ICD-10-CM | POA: Insufficient documentation

## 2019-02-02 DIAGNOSIS — Z79899 Other long term (current) drug therapy: Secondary | ICD-10-CM | POA: Insufficient documentation

## 2019-02-02 DIAGNOSIS — N730 Acute parametritis and pelvic cellulitis: Secondary | ICD-10-CM

## 2019-02-02 DIAGNOSIS — B9689 Other specified bacterial agents as the cause of diseases classified elsewhere: Secondary | ICD-10-CM

## 2019-02-02 DIAGNOSIS — F17210 Nicotine dependence, cigarettes, uncomplicated: Secondary | ICD-10-CM | POA: Insufficient documentation

## 2019-02-02 DIAGNOSIS — G8929 Other chronic pain: Secondary | ICD-10-CM | POA: Insufficient documentation

## 2019-02-02 DIAGNOSIS — M545 Low back pain: Secondary | ICD-10-CM | POA: Insufficient documentation

## 2019-02-02 DIAGNOSIS — N760 Acute vaginitis: Secondary | ICD-10-CM | POA: Insufficient documentation

## 2019-02-02 DIAGNOSIS — A6920 Lyme disease, unspecified: Secondary | ICD-10-CM | POA: Insufficient documentation

## 2019-02-02 LAB — CBC WITH DIFFERENTIAL/PLATELET
Abs Immature Granulocytes: 0.01 10*3/uL (ref 0.00–0.07)
Basophils Absolute: 0 10*3/uL (ref 0.0–0.1)
Basophils Relative: 1 %
Eosinophils Absolute: 0.1 10*3/uL (ref 0.0–0.5)
Eosinophils Relative: 2 %
HCT: 41.3 % (ref 36.0–46.0)
Hemoglobin: 13.4 g/dL (ref 12.0–15.0)
Immature Granulocytes: 0 %
Lymphocytes Relative: 51 %
Lymphs Abs: 2.4 10*3/uL (ref 0.7–4.0)
MCH: 29.6 pg (ref 26.0–34.0)
MCHC: 32.4 g/dL (ref 30.0–36.0)
MCV: 91.2 fL (ref 80.0–100.0)
Monocytes Absolute: 0.3 10*3/uL (ref 0.1–1.0)
Monocytes Relative: 7 %
Neutro Abs: 1.8 10*3/uL (ref 1.7–7.7)
Neutrophils Relative %: 39 %
Platelets: 154 10*3/uL (ref 150–400)
RBC: 4.53 MIL/uL (ref 3.87–5.11)
RDW: 12.5 % (ref 11.5–15.5)
WBC: 4.7 10*3/uL (ref 4.0–10.5)
nRBC: 0 % (ref 0.0–0.2)

## 2019-02-02 LAB — URINALYSIS, ROUTINE W REFLEX MICROSCOPIC
Bilirubin Urine: NEGATIVE
Glucose, UA: NEGATIVE mg/dL
Hgb urine dipstick: NEGATIVE
Ketones, ur: NEGATIVE mg/dL
Leukocytes,Ua: NEGATIVE
Nitrite: NEGATIVE
Protein, ur: NEGATIVE mg/dL
Specific Gravity, Urine: 1.002 — ABNORMAL LOW (ref 1.005–1.030)
pH: 5 (ref 5.0–8.0)

## 2019-02-02 LAB — COMPREHENSIVE METABOLIC PANEL
ALT: 11 U/L (ref 0–44)
AST: 15 U/L (ref 15–41)
Albumin: 3.8 g/dL (ref 3.5–5.0)
Alkaline Phosphatase: 57 U/L (ref 38–126)
Anion gap: 6 (ref 5–15)
BUN: 6 mg/dL (ref 6–20)
CO2: 30 mmol/L (ref 22–32)
Calcium: 8.3 mg/dL — ABNORMAL LOW (ref 8.9–10.3)
Chloride: 101 mmol/L (ref 98–111)
Creatinine, Ser: 0.86 mg/dL (ref 0.44–1.00)
GFR calc Af Amer: >60 mL/min (ref >60)
GFR calc non Af Amer: >60 mL/min (ref >60)
Glucose, Bld: 84 mg/dL (ref 70–99)
Potassium: 3.7 mmol/L (ref 3.5–5.1)
Sodium: 137 mmol/L (ref 135–145)
Total Bilirubin: 0.5 mg/dL (ref 0.3–1.2)
Total Protein: 6.3 g/dL — ABNORMAL LOW (ref 6.5–8.1)

## 2019-02-02 LAB — WET PREP, GENITAL
Sperm: NONE SEEN
Trich, Wet Prep: NONE SEEN
Yeast Wet Prep HPF POC: NONE SEEN

## 2019-02-02 MED ORDER — CEFTRIAXONE SODIUM 250 MG IJ SOLR
250.0000 mg | Freq: Once | INTRAMUSCULAR | Status: AC
  Administered 2019-02-02: 250 mg via INTRAMUSCULAR
  Filled 2019-02-02: qty 250

## 2019-02-02 MED ORDER — ONDANSETRON 4 MG PO TBDP: 4.0000 mg | ORAL_TABLET | Freq: Three times a day (TID) | ORAL | 0 refills | Status: DC | PRN

## 2019-02-02 MED ORDER — METRONIDAZOLE 500 MG PO TABS: 500.0000 mg | ORAL_TABLET | Freq: Two times a day (BID) | ORAL | 0 refills | Status: DC

## 2019-02-02 MED ORDER — DOXYCYCLINE HYCLATE 100 MG PO CAPS
100.0000 mg | ORAL_CAPSULE | Freq: Two times a day (BID) | ORAL | 0 refills | Status: AC
Start: 2019-02-02 — End: 2019-02-16

## 2019-02-02 NOTE — ED Triage Notes (Signed)
Pt reports bladder pain and urinary frequency and painful urination starting today

## 2019-02-02 NOTE — Discharge Instructions (Addendum)
As dicussed, you have bacterial vaginosis and possible pelvic inflammatory disease. You received one dose of antibiotics in the ED. I am sending you home with two different antibiotics. Flagyl is for bacterial vaginosis. You will take it twice a day for 7 days. Do not drink alcohol while on the medication. The 2nd antibiotic is for pelvic inflammatory disorder which is called doxycycline. You will take it twice a day for 14 days. I am also sending you home with Zofran just in case the antibiotics cause nausea. Make sure to complete all antibiotics. Follow-up with your PCP if your symptoms do not improve within the next week. Your urine culture and gonorrhea/chlamydia tests are still pending. Make sure to use protection during intercourse. If you do have an STD, you can be reinfected after treatment. Your partner will also need to be treated. Return to the ER for new or worsening symptoms.

## 2019-02-02 NOTE — ED Provider Notes (Signed)
[facility] EMERGENCY DEPARTMENT Provider Note   CSN: [id number] Arrival date & time: 02/02/19  1658     History Chief Complaint  Patient presents with  . Urinary Tract Infection    Leslie Bowers is a 48 y.o. female with a past medical history significant for chronic low back pain and Lyme disease who presents to the ED due to sudden onset of dysuria, urinary frequency, and bladder pressure x1 day.  Patient states yesterday she felt a "twinge" with urination, but felt severe dysuria and bladder pressure today around 2 PM.  Patient states she has had numerous UTIs in the past, but no UTIs for the past 4 to 5 years. She notes that her symptoms feel similar to past UTIs. She has chronic low back pain, but denies worsening low back pain over the past day along with her urinary symptoms. Patient denies fever and chills.  Patient is sexually active with 1 partner with no protection. Patient denies vaginal symptoms. Patient denies concern for STDs. Patient has a history of a hysterectomy. Patient has not tried anything for her symptoms prior to arrival. Patient denies nausea, vomiting, hematuria and abdominal pain.     Past Medical History:  Diagnosis Date  . Chronic back pain   . Lyme disease     There are no problems to display for this patient.   Past Surgical History:  Procedure Laterality Date  . ABDOMINAL HYSTERECTOMY    . CESAREAN SECTION    . GASTRIC BYPASS       OB History    Gravida  3   Para      Term      Preterm      AB      Living  3     SAB      TAB      Ectopic      Multiple      Live Births              No family history on file.  Social History   Tobacco Use  . Smoking status: Current Every Day Smoker    Packs/day: 1.00    Types: Cigarettes  . Smokeless tobacco: Never Used  Substance Use Topics  . Alcohol use: No  . Drug use: No    Home Medications Prior to Admission medications   Medication Sig Start Date End Date Taking?  Authorizing Provider  baclofen (LIORESAL) 10 MG tablet Take 10 mg by mouth 4 (four) times daily. 01/10/19  Yes [provider]  buprenorphine (SUBUTEX) 8 MG SUBL SL tablet Place 8 mg under the tongue 2 (two) times daily.  01/10/19  Yes [provider]  meloxicam (MOBIC) 7.5 MG tablet Take 7.5 mg by mouth every other day as needed for pain.  12/14/18  Yes [provider]  traMADol (ULTRAM) 50 MG tablet Take 1 tablet (50 mg total) by mouth every 6 (six) hours as needed. Patient taking differently: Take 50 mg by mouth every 6 (six) hours as needed for moderate pain or severe pain.  03/30/15  Yes Donnetta Hutchingook, Brian, MD  doxycycline (VIBRAMYCIN) 100 MG capsule Take 1 capsule (100 mg total) by mouth 2 (two) times daily for 14 days. 02/02/19 02/16/19  Mannie StabileAberman, Fontaine Kossman C, PA-C  metroNIDAZOLE (FLAGYL) 500 MG tablet Take 1 tablet (500 mg total) by mouth 2 (two) times daily. 02/02/19   Mannie StabileAberman, Cailynn Bodnar C, PA-C  ondansetron (ZOFRAN ODT) 4 MG disintegrating tablet Take 1 tablet (4 mg total) by  mouth every 8 (eight) hours as needed for nausea or vomiting. 02/02/19   Suzy Bouchard, PA-C    Allergies    Dilaudid [hydromorphone hcl], Gabapentin, and Duloxetine  Review of Systems   Review of Systems  Constitutional: Negative for chills and fever.  Respiratory: Negative for shortness of breath.   Cardiovascular: Negative for chest pain.  Gastrointestinal: Negative for abdominal pain, diarrhea, nausea and vomiting.  Genitourinary: Positive for dysuria, frequency, pelvic pain (suprapubic pressure with urination) and urgency. Negative for decreased urine volume, flank pain, hematuria, vaginal bleeding and vaginal discharge.  Musculoskeletal: Positive for back pain (chronic low back pain).  Neurological: Negative for headaches.  All other systems reviewed and are negative.   Physical Exam Updated Vital Signs BP 132/68 (BP Location: Right Arm)   Pulse 65   Temp 98.9 F (37.2 C) (Oral)    Resp 16   Ht 5\' 4"  (1.626 m)   Wt 72.6 kg   SpO2 98%   BMI 27.46 kg/m   Physical Exam Vitals and nursing note reviewed. Exam conducted with a chaperone present.  Constitutional:      General: She is not in acute distress.    Appearance: She is not ill-appearing.  HENT:     Head: Normocephalic.  Eyes:     Pupils: Pupils are equal, round, and reactive to light.  Cardiovascular:     Rate and Rhythm: Normal rate and regular rhythm.     Pulses: Normal pulses.     Heart sounds: Normal heart sounds. No murmur. No friction rub. No gallop.   Pulmonary:     Effort: Pulmonary effort is normal.     Breath sounds: Normal breath sounds.  Abdominal:     General: Abdomen is flat. Bowel sounds are normal. There is no distension.     Palpations: Abdomen is soft.     Tenderness: There is abdominal tenderness. There is no right CVA tenderness, left CVA tenderness, guarding or rebound.     Comments: Mild suprapubic tenderness to palpation. Negative tenderness to Mcburney's point. Negative Murphy's sign  Genitourinary:    Exam position: Supine.     Comments: Vulva without lesions or abnormality Vaginal canal without abnormal discharge or lesion Cervix appears normal, is closed with mild white discharge Significant amount of tenderness on bimanual exam with possible CMT? Musculoskeletal:     Cervical back: Neck supple.     Comments: Able to move all 4 extremities without difficulty. No lower extremity edema.   Skin:    General: Skin is warm.  Neurological:     General: No focal deficit present.     Mental Status: She is alert.     ED Results / Procedures / Treatments   Labs (all labs ordered are listed, but only abnormal results are displayed) Labs Reviewed  WET PREP, GENITAL - Abnormal; Notable for the following components:      Result Value   Clue Cells Wet Prep HPF POC PRESENT (*)    WBC, Wet Prep HPF POC FEW (*)    All other components within normal limits  URINALYSIS, ROUTINE W  REFLEX MICROSCOPIC - Abnormal; Notable for the following components:   Color, Urine STRAW (*)    Specific Gravity, Urine 1.002 (*)    All other components within normal limits  COMPREHENSIVE METABOLIC PANEL - Abnormal; Notable for the following components:   Calcium 8.3 (*)    Total Protein 6.3 (*)    All other components within normal limits  URINE CULTURE  CBC WITH DIFFERENTIAL/PLATELET  GC/CHLAMYDIA PROBE AMP ([place]) NOT AT [facility]    EKG None  Radiology No results found.  Procedures Procedures (including critical care time)  Medications Ordered in ED Medications  cefTRIAXone (ROCEPHIN) injection 250 mg (has no administration in time range)    ED Course  I have reviewed the triage vital signs and the nursing notes.  Pertinent labs & imaging results that were available during my care of the patient were reviewed by me and considered in my medical decision making (see chart for details).    MDM Rules/Calculators/A&P                      48 year old female presents to the ED for evaluation of dysuria, urinary frequency, and bladder pressure x1 day.  Vitals all within normal limits.  Patient is afebrile, non-septic, and non-ill appearing.  Abdomen soft, nondistended with mild suprapubic tenderness.  No peritoneal signs.  No CVA tenderness bilaterally.  Suspect symptoms could be related to a UTI vs. STD vs. vaginitis. Doubt pyelonephritis given negative CVA tenderness and patient is afebrile and rather well appearing.   CBC reassuring with no leukocytosis. UA negative for signs of infection and hematuria. Urine culture pending. CMP reassuring with mild hypocalcemia at 8.3 and decreased total protein at 6.3, but otherwise normal.  Wet prep positive for clue cells and a few white blood cells.  Will treat for bacterial vaginosis with Flagyl.  Given patient's significant tenderness during the bimanual exam will also treat for PID.  Patient is agreement with the plan.  1 dose of  Rocephin given here in the ED. Patient will be discharged with 14 days of doxycycline. Patient advised to use protection during intercourse and have her partner treated/tested as well. Patient instructed to follow-up with PCP if symptoms do not improve within the next week. Strict ED precautions discussed with patient. Patient states understanding and agrees to plan. Patient discharged home in no acute distress and stable vitals.  Final Clinical Impression(s) / ED Diagnoses Final diagnoses:  PID (acute pelvic inflammatory disease)  BV (bacterial vaginosis)    Rx / DC Orders ED Discharge Orders         Ordered    metroNIDAZOLE (FLAGYL) 500 MG tablet  2 times daily     02/02/19 1928    doxycycline (VIBRAMYCIN) 100 MG capsule  2 times daily     02/02/19 1928    ondansetron (ZOFRAN ODT) 4 MG disintegrating tablet  Every 8 hours PRN     02/02/19 1930           Jesusita Oka 02/02/19 1951    Sabas Sous, MD 02/02/19 2225

## 2019-02-04 LAB — URINE CULTURE: Culture: NO GROWTH

## 2019-02-07 LAB — GC/CHLAMYDIA PROBE AMP (~~LOC~~) NOT AT ARMC
Chlamydia: NEGATIVE
Neisseria Gonorrhea: NEGATIVE

## 2019-06-08 ENCOUNTER — Other Ambulatory Visit: Payer: Self-pay

## 2019-06-08 ENCOUNTER — Encounter: Payer: Self-pay | Admitting: Adult Health

## 2019-06-08 ENCOUNTER — Ambulatory Visit (INDEPENDENT_AMBULATORY_CARE_PROVIDER_SITE_OTHER): Payer: Self-pay | Admitting: Adult Health

## 2019-06-08 VITALS — BP 104/55 | HR 70 | Ht 64.0 in | Wt 171.0 lb

## 2019-06-08 DIAGNOSIS — N952 Postmenopausal atrophic vaginitis: Secondary | ICD-10-CM

## 2019-06-08 DIAGNOSIS — N930 Postcoital and contact bleeding: Secondary | ICD-10-CM | POA: Insufficient documentation

## 2019-06-08 MED ORDER — PREMARIN 0.625 MG/GM VA CREA: TOPICAL_CREAM | VAGINAL | 0 refills | Status: AC

## 2019-06-08 NOTE — Progress Notes (Signed)
Patient ID: Leslie Bowers, female   DOB: 12-12-1970, 49 y.o.   MRN: [id number] History of Present Illness: Leslie Bowers is a 49 year old white female,divorced with partner, sp hysterectomy in complaining of bleeding after sex, partner is big around. Her hysteerctom yf ro for bleeding, she denies any MI,stroke, DVT or breast cancer.  PCP is Dr Fran Lowes.   Current Medications, Allergies, Past Medical History, Past Surgical History, Family History and Social History were reviewed in [employer] record.     Review of Systems: +bleeding after sex    Physical Exam:BP (!) 104/55 (BP Location: Left Arm, Patient Position: Sitting, Cuff Size: Normal)   Pulse 70   Ht 5\' 4"  (1.626 m)   Wt 171 lb (77.6 kg)   BMI 29.35 kg/m  General:  Well developed, well nourished, no acute distress Skin:  Warm and dry Lungs; Clear to auscultation bilaterally Breast:  No dominant palpable mass, retraction, or nipple discharge Cardiovascular: Regular rate and rhythm Pelvic:  External genitalia is normal in appearance, no lesions.  The vagina is pale with loss of moisture and rugae. Urethra has no lesions or masses. The cervix and uterus are absent.  No adnexal masses or tenderness noted.Bladder is non tender, no masses felt. Extremities/musculoskeletal:  No swelling or varicosities noted, no clubbing or cyanosis Psych:  No mood changes, alert and cooperative,seems happy AA is 0 Fall risk is low PHQ 9 score is 4 Examination chaperoned by LPN.   Impression and Plan: 1. Vaginal atrophy Will give PVC to try Meds ordered this encounter  Medications  . conjugated estrogens (PREMARIN) vaginal cream    Sig: Use 0.5 gm in vagina bid for 2 weeks then 2 x weekly    Dispense:  24 g    Refill:  0    Order Specific Question:   Supervising Provider    Answer:   Faith Rogue H [[id number]]  No sex for 2 weeks  Then let me know how it is going for refill, as she travels with horses  alot   2. Postcoital bleeding Use good lubricate Try different positions

## 2019-06-21 ENCOUNTER — Emergency Department (HOSPITAL_COMMUNITY)
Admission: EM | Admit: 2019-06-21 | Discharge: 2019-06-21 | Disposition: A | Discharge status: Home / Self Care | Payer: Self-pay | Attending: Emergency Medicine | Admitting: Emergency Medicine

## 2019-06-21 ENCOUNTER — Other Ambulatory Visit: Payer: Self-pay

## 2019-06-21 ENCOUNTER — Encounter (HOSPITAL_COMMUNITY): Payer: Self-pay | Admitting: Emergency Medicine

## 2019-06-21 ENCOUNTER — Emergency Department (HOSPITAL_COMMUNITY): Payer: Self-pay

## 2019-06-21 DIAGNOSIS — J181 Lobar pneumonia, unspecified organism: Secondary | ICD-10-CM | POA: Insufficient documentation

## 2019-06-21 DIAGNOSIS — F17210 Nicotine dependence, cigarettes, uncomplicated: Secondary | ICD-10-CM | POA: Insufficient documentation

## 2019-06-21 DIAGNOSIS — J189 Pneumonia, unspecified organism: Secondary | ICD-10-CM

## 2019-06-21 DIAGNOSIS — Z20822 Contact with and (suspected) exposure to covid-19: Secondary | ICD-10-CM | POA: Insufficient documentation

## 2019-06-21 DIAGNOSIS — Z79899 Other long term (current) drug therapy: Secondary | ICD-10-CM | POA: Insufficient documentation

## 2019-06-21 LAB — COMPREHENSIVE METABOLIC PANEL
ALT: 8 U/L (ref 0–44)
AST: 9 U/L — ABNORMAL LOW (ref 15–41)
Albumin: 3.2 g/dL — ABNORMAL LOW (ref 3.5–5.0)
Alkaline Phosphatase: 75 U/L (ref 38–126)
Anion gap: 8 (ref 5–15)
BUN: 6 mg/dL (ref 6–20)
CO2: 27 mmol/L (ref 22–32)
Calcium: 8.3 mg/dL — ABNORMAL LOW (ref 8.9–10.3)
Chloride: 101 mmol/L (ref 98–111)
Creatinine, Ser: 0.67 mg/dL (ref 0.44–1.00)
GFR calc Af Amer: >60 mL/min (ref >60)
GFR calc non Af Amer: >60 mL/min (ref >60)
Glucose, Bld: 111 mg/dL — ABNORMAL HIGH (ref 70–99)
Potassium: 3.5 mmol/L (ref 3.5–5.1)
Sodium: 136 mmol/L (ref 135–145)
Total Bilirubin: 0.5 mg/dL (ref 0.3–1.2)
Total Protein: 6.6 g/dL (ref 6.5–8.1)

## 2019-06-21 LAB — URINALYSIS, ROUTINE W REFLEX MICROSCOPIC
Bacteria, UA: NONE SEEN
Bilirubin Urine: NEGATIVE
Glucose, UA: NEGATIVE mg/dL
Ketones, ur: NEGATIVE mg/dL
Leukocytes,Ua: NEGATIVE
Nitrite: NEGATIVE
Protein, ur: NEGATIVE mg/dL
Specific Gravity, Urine: 1.003 — ABNORMAL LOW (ref 1.005–1.030)
pH: 5 (ref 5.0–8.0)

## 2019-06-21 LAB — CBC WITH DIFFERENTIAL/PLATELET
Abs Immature Granulocytes: 0.03 10*3/uL (ref 0.00–0.07)
Basophils Absolute: 0 10*3/uL (ref 0.0–0.1)
Basophils Relative: 1 %
Eosinophils Absolute: 0.1 10*3/uL (ref 0.0–0.5)
Eosinophils Relative: 1 %
HCT: 38.9 % (ref 36.0–46.0)
Hemoglobin: 12.8 g/dL (ref 12.0–15.0)
Immature Granulocytes: 0 %
Lymphocytes Relative: 14 %
Lymphs Abs: 1.1 10*3/uL (ref 0.7–4.0)
MCH: 30.4 pg (ref 26.0–34.0)
MCHC: 32.9 g/dL (ref 30.0–36.0)
MCV: 92.4 fL (ref 80.0–100.0)
Monocytes Absolute: 0.5 10*3/uL (ref 0.1–1.0)
Monocytes Relative: 6 %
Neutro Abs: 6.1 10*3/uL (ref 1.7–7.7)
Neutrophils Relative %: 78 %
Platelets: 204 10*3/uL (ref 150–400)
RBC: 4.21 MIL/uL (ref 3.87–5.11)
RDW: 13.3 % (ref 11.5–15.5)
WBC: 7.7 10*3/uL (ref 4.0–10.5)
nRBC: 0 % (ref 0.0–0.2)

## 2019-06-21 LAB — SARS CORONAVIRUS 2 BY RT PCR (HOSPITAL ORDER, PERFORMED IN ~~LOC~~ HOSPITAL LAB): SARS Coronavirus 2: NEGATIVE

## 2019-06-21 LAB — LACTIC ACID, PLASMA: Lactic Acid, Venous: 1.1 mmol/L (ref 0.5–1.9)

## 2019-06-21 MED ORDER — ACETAMINOPHEN 325 MG PO TABS
650.0000 mg | ORAL_TABLET | Freq: Once | ORAL | Status: AC | PRN
  Administered 2019-06-21: 13:00:00 650 mg via ORAL
  Filled 2019-06-21: qty 2

## 2019-06-21 MED ORDER — DOXYCYCLINE HYCLATE 100 MG PO CAPS: ORAL_CAPSULE | ORAL | 0 refills | Status: AC

## 2019-06-21 MED ORDER — CEFTRIAXONE SODIUM 1 G IJ SOLR
1.0000 g | Freq: Once | INTRAMUSCULAR | Status: AC
  Administered 2019-06-21: 1 g via INTRAMUSCULAR
  Filled 2019-06-21: qty 10

## 2019-06-21 MED ORDER — LIDOCAINE HCL (PF) 1 % IJ SOLN
INTRAMUSCULAR | Status: AC
  Filled 2019-06-21: qty 30

## 2019-06-21 NOTE — ED Triage Notes (Addendum)
Fever, headache, body aches and mild cough since Sunday.  Pt states she feels like this when her lyme disease has a flair up.

## 2019-06-21 NOTE — Discharge Instructions (Addendum)
Follow-up with your family doctor next week for recheck.  Return if any problem 

## 2019-06-21 NOTE — ED Provider Notes (Signed)
[facility] EMERGENCY DEPARTMENT Provider Note   CSN: [id number] Arrival date & time: 06/21/19  1237     History Chief Complaint  Patient presents with  . Fever    Leslie Bowers is a 49 y.o. female.  Patient complains of fever and cough and weakness.  The history is provided by the patient. No language interpreter was used.  Fever Max temp prior to arrival:  102 Temp source:  Oral Severity:  Moderate Onset quality:  Sudden Timing:  Constant Progression:  Worsening Chronicity:  New Relieved by:  Nothing Worsened by:  Nothing Ineffective treatments:  None tried Associated symptoms: cough   Associated symptoms: no chest pain, no congestion, no diarrhea, no headaches and no rash        Past Medical History:  Diagnosis Date  . Chronic back pain   . Endometriosis   . Lyme disease     Patient Active Problem List   Diagnosis Date Noted  . Vaginal atrophy 06/08/2019  . Postcoital bleeding 06/08/2019    Past Surgical History:  Procedure Laterality Date  . ABDOMINAL HYSTERECTOMY    . CESAREAN SECTION    . GASTRIC BYPASS       OB History    Gravida  3   Para      Term      Preterm      AB      Living  3     SAB      TAB      Ectopic      Multiple      Live Births              Family History  Problem Relation Age of Onset  . Diabetes Paternal Grandmother   . Heart disease Paternal Grandmother   . Diabetes Father   . Heart disease Father   . Kidney disease Father     Social History   Tobacco Use  . Smoking status: Current Every Day Smoker    Packs/day: 1.00    Types: Cigarettes  . Smokeless tobacco: Never Used  Substance Use Topics  . Alcohol use: No  . Drug use: No    Home Medications Prior to Admission medications   Medication Sig Start Date End Date Taking? Authorizing Provider  Aspirin-Acetaminophen-Caffeine (GOODY HEADACHE PO) Take 1-2 packets by mouth daily as needed (for pain).   Yes [provider]    baclofen (LIORESAL) 10 MG tablet Take 10 mg by mouth 6 (six) times daily.  01/10/19  Yes [provider]  buprenorphine (SUBUTEX) 8 MG SUBL SL tablet Place 8 mg under the tongue 2 (two) times daily.  01/10/19  Yes [provider]  meloxicam (MOBIC) 7.5 MG tablet Take 7.5 mg by mouth every other day as needed for pain.  12/14/18  Yes [provider]  Pseudoephedrine-APAP-DM (DAYQUIL MULTI-SYMPTOM COLD/FLU PO) Take 10-15 mLs by mouth daily as needed (for cold symptoms).   Yes [provider]  traMADol (ULTRAM) 50 MG tablet Take 1 tablet (50 mg total) by mouth every 6 (six) hours as needed. Patient taking differently: Take 50 mg by mouth every 6 (six) hours as needed for moderate pain or severe pain.  03/30/15  Yes Donnetta Hutching, MD  conjugated estrogens (PREMARIN) vaginal cream Use 0.5 gm in vagina bid for 2 weeks then 2 x weekly 06/08/19   Adline Potter, NP  doxycycline (VIBRAMYCIN) 100 MG capsule One po bid 06/21/19   Bethann Berkshire, MD  Allergies    Dilaudid [hydromorphone hcl], Gabapentin, and Duloxetine  Review of Systems   Review of Systems  Constitutional: Positive for fever. Negative for appetite change and fatigue.  HENT: Negative for congestion, ear discharge and sinus pressure.   Eyes: Negative for discharge.  Respiratory: Positive for cough.   Cardiovascular: Negative for chest pain.  Gastrointestinal: Negative for abdominal pain and diarrhea.  Genitourinary: Negative for frequency and hematuria.  Musculoskeletal: Negative for back pain.  Skin: Negative for rash.  Neurological: Negative for seizures and headaches.  Psychiatric/Behavioral: Negative for hallucinations.    Physical Exam Updated Vital Signs BP (!) 141/69 (BP Location: Right Arm)   Pulse (!) 106   Temp (!) 102.4 F (39.1 C) (Oral)   Resp 19   Ht 5\' 4"  (1.626 m)   Wt 74.8 kg   SpO2 97%   BMI 28.32 kg/m   Physical Exam Vitals and nursing note reviewed.   Constitutional:      Appearance: She is well-developed.  HENT:     Head: Normocephalic.     Mouth/Throat:     Mouth: Mucous membranes are moist.  Eyes:     General: No scleral icterus.    Conjunctiva/sclera: Conjunctivae normal.  Neck:     Thyroid: No thyromegaly.  Cardiovascular:     Rate and Rhythm: Normal rate and regular rhythm.     Heart sounds: No murmur. No friction rub. No gallop.   Pulmonary:     Breath sounds: No stridor. No wheezing or rales.  Chest:     Chest wall: No tenderness.  Abdominal:     General: There is no distension.     Tenderness: There is no abdominal tenderness. There is no rebound.  Musculoskeletal:        General: Normal range of motion.     Cervical back: Neck supple.  Lymphadenopathy:     Cervical: No cervical adenopathy.  Skin:    Findings: No erythema or rash.  Neurological:     Mental Status: She is alert and oriented to person, place, and time.     Motor: No abnormal muscle tone.     Coordination: Coordination normal.  Psychiatric:        Behavior: Behavior normal.     ED Results / Procedures / Treatments   Labs (all labs ordered are listed, but only abnormal results are displayed) Labs Reviewed  COMPREHENSIVE METABOLIC PANEL - Abnormal; Notable for the following components:      Result Value   Glucose, Bld 111 (*)    Calcium 8.3 (*)    Albumin 3.2 (*)    AST 9 (*)    All other components within normal limits  URINALYSIS, ROUTINE W REFLEX MICROSCOPIC - Abnormal; Notable for the following components:   Specific Gravity, Urine 1.003 (*)    Hgb urine dipstick SMALL (*)    All other components within normal limits  SARS CORONAVIRUS 2 BY RT PCR (HOSPITAL ORDER, PERFORMED IN [facility])  LACTIC ACID, PLASMA  CBC WITH DIFFERENTIAL/PLATELET    EKG None  Radiology DG Chest 2 View  Result Date: 06/21/2019 CLINICAL DATA:  Cough and fever EXAM: CHEST - 2 VIEW COMPARISON:  March 25, 2017 FINDINGS: There is  airspace opacity with consolidation in a portion of the right middle lobe. Lungs elsewhere clear. Heart size and pulmonary vascular normal. No adenopathy. There are surgical clips in the left upper abdomen. IMPRESSION: Right middle lobe airspace opacity consistent with pneumonia. Lungs elsewhere clear. Cardiac  silhouette normal. Followup PA and lateral chest radiographs recommended in 3-4 weeks following trial of antibiotic therapy to ensure resolution and exclude underlying malignancy. Electronically Signed   By: Lowella Grip III M.D.   On: 06/21/2019 13:44    Procedures Procedures (including critical care time)  Medications Ordered in ED Medications  lidocaine (PF) (XYLOCAINE) 1 % injection (has no administration in time range)  acetaminophen (TYLENOL) tablet 650 mg (650 mg Oral Given 06/21/19 1305)  cefTRIAXone (ROCEPHIN) injection 1 g (1 g Intramuscular Given 06/21/19 1726)    ED Course  I have reviewed the triage vital signs and the nursing notes.  Pertinent labs & imaging results that were available during my care of the patient were reviewed by me and considered in my medical decision making (see chart for details).    MDM Rules/Calculators/A&P                      Patient with fever and cough.  Chest x-ray shows pneumonia.  Patient is nontoxic and will be treated as an outpatient.  She was will be given doxycycline and follow-up with her PCP.     This patient presents to the ED for concern of fever, this involves an extensive number of treatment options, and is a complaint that carries with it a high risk of complications and morbidity.  The differential diagnosis includes UTI, Covid, pneumonia   Lab Tests:   I Ordered, reviewed, and interpreted labs, which included CBC chemistries Covid test which were all unremarkable  Medicines ordered:    I ordered medication Rocephin for pneumonia  Imaging Studies ordered:   I ordered imaging studies which included chest X  and  I independently visualized and interpreted imaging which showed right middle lobe pneumonia  Additional history obtained:   Additional history obtained from records  Previous records obtained and reviewed   Consultations Obtained:   Reevaluation:  After the interventions stated above, I reevaluated the patient and found no change  Critical Interventions:  .   Final Clinical Impression(s) / ED Diagnoses Final diagnoses:  Community acquired pneumonia of right middle lobe of lung    Rx / DC Orders ED Discharge Orders         Ordered    doxycycline (VIBRAMYCIN) 100 MG capsule     06/21/19 1727           Milton Ferguson, MD 06/21/19 1732

## 2020-07-01 ENCOUNTER — Other Ambulatory Visit: Payer: Self-pay

## 2020-07-01 ENCOUNTER — Emergency Department (HOSPITAL_COMMUNITY)
Admission: EM | Admit: 2020-07-01 | Discharge: 2020-07-01 | Disposition: A | Discharge status: Home / Self Care | Payer: Medicaid Other | Attending: Emergency Medicine | Admitting: Emergency Medicine

## 2020-07-01 ENCOUNTER — Encounter (HOSPITAL_COMMUNITY): Payer: Self-pay | Admitting: *Deleted

## 2020-07-01 ENCOUNTER — Emergency Department (HOSPITAL_COMMUNITY): Payer: Medicaid Other

## 2020-07-01 DIAGNOSIS — F17210 Nicotine dependence, cigarettes, uncomplicated: Secondary | ICD-10-CM | POA: Insufficient documentation

## 2020-07-01 DIAGNOSIS — M7989 Other specified soft tissue disorders: Secondary | ICD-10-CM

## 2020-07-01 DIAGNOSIS — R0600 Dyspnea, unspecified: Secondary | ICD-10-CM

## 2020-07-01 DIAGNOSIS — R0602 Shortness of breath: Secondary | ICD-10-CM | POA: Insufficient documentation

## 2020-07-01 DIAGNOSIS — R2243 Localized swelling, mass and lump, lower limb, bilateral: Secondary | ICD-10-CM | POA: Insufficient documentation

## 2020-07-01 DIAGNOSIS — R0609 Other forms of dyspnea: Secondary | ICD-10-CM

## 2020-07-01 DIAGNOSIS — R053 Chronic cough: Secondary | ICD-10-CM | POA: Insufficient documentation

## 2020-07-01 DIAGNOSIS — R350 Frequency of micturition: Secondary | ICD-10-CM | POA: Insufficient documentation

## 2020-07-01 LAB — URINALYSIS, ROUTINE W REFLEX MICROSCOPIC
Bilirubin Urine: NEGATIVE
Glucose, UA: NEGATIVE mg/dL
Hgb urine dipstick: NEGATIVE
Ketones, ur: NEGATIVE mg/dL
Leukocytes,Ua: NEGATIVE
Nitrite: NEGATIVE
Protein, ur: NEGATIVE mg/dL
Specific Gravity, Urine: 1.01 (ref 1.005–1.030)
pH: 5 (ref 5.0–8.0)

## 2020-07-01 LAB — COMPREHENSIVE METABOLIC PANEL
ALT: 14 U/L (ref 0–44)
AST: 15 U/L (ref 15–41)
Albumin: 4.1 g/dL (ref 3.5–5.0)
Alkaline Phosphatase: 63 U/L (ref 38–126)
Anion gap: 8 (ref 5–15)
BUN: 10 mg/dL (ref 6–20)
CO2: 27 mmol/L (ref 22–32)
Calcium: 8.8 mg/dL — ABNORMAL LOW (ref 8.9–10.3)
Chloride: 102 mmol/L (ref 98–111)
Creatinine, Ser: 0.63 mg/dL (ref 0.44–1.00)
GFR, Estimated: >60 mL/min (ref >60)
Glucose, Bld: 102 mg/dL — ABNORMAL HIGH (ref 70–99)
Potassium: 3.8 mmol/L (ref 3.5–5.1)
Sodium: 137 mmol/L (ref 135–145)
Total Bilirubin: 0.4 mg/dL (ref 0.3–1.2)
Total Protein: 7.2 g/dL (ref 6.5–8.1)

## 2020-07-01 LAB — CBC WITH DIFFERENTIAL/PLATELET
Abs Immature Granulocytes: 0.01 10*3/uL (ref 0.00–0.07)
Basophils Absolute: 0 10*3/uL (ref 0.0–0.1)
Basophils Relative: 1 %
Eosinophils Absolute: 0.1 10*3/uL (ref 0.0–0.5)
Eosinophils Relative: 2 %
HCT: 44.2 % (ref 36.0–46.0)
Hemoglobin: 14.5 g/dL (ref 12.0–15.0)
Immature Granulocytes: 0 %
Lymphocytes Relative: 47 %
Lymphs Abs: 2.4 10*3/uL (ref 0.7–4.0)
MCH: 30.3 pg (ref 26.0–34.0)
MCHC: 32.8 g/dL (ref 30.0–36.0)
MCV: 92.5 fL (ref 80.0–100.0)
Monocytes Absolute: 0.3 10*3/uL (ref 0.1–1.0)
Monocytes Relative: 7 %
Neutro Abs: 2.2 10*3/uL (ref 1.7–7.7)
Neutrophils Relative %: 43 %
Platelets: 220 10*3/uL (ref 150–400)
RBC: 4.78 MIL/uL (ref 3.87–5.11)
RDW: 12.6 % (ref 11.5–15.5)
WBC: 5 10*3/uL (ref 4.0–10.5)
nRBC: 0 % (ref 0.0–0.2)

## 2020-07-01 LAB — CBG MONITORING, ED: Glucose-Capillary: 84 mg/dL (ref 70–99)

## 2020-07-01 LAB — LIPASE, BLOOD: Lipase: 27 U/L (ref 11–51)

## 2020-07-01 LAB — BRAIN NATRIURETIC PEPTIDE: B Natriuretic Peptide: 23 pg/mL (ref 0.0–100.0)

## 2020-07-01 MED ORDER — FUROSEMIDE 20 MG PO TABS: 20.0000 mg | ORAL_TABLET | Freq: Every day | ORAL | 0 refills | Status: AC

## 2020-07-01 MED ORDER — ALBUTEROL SULFATE HFA 108 (90 BASE) MCG/ACT IN AERS
2.0000 | INHALATION_SPRAY | RESPIRATORY_TRACT | Status: DC | PRN
  Administered 2020-07-01: 2 via RESPIRATORY_TRACT
  Filled 2020-07-01: qty 6.7

## 2020-07-01 NOTE — Discharge Instructions (Signed)
Take Lasix daily for the next 5 days.  This will increase the amount that you urinate, so I recommend you take it in the morning. Use the inhaler every 4 hours while awake for the next 2 days.  After this, use as needed for shortness of breath or chest tightness. Call your primary care doctor to set up a follow-up appointment. Return to the emergency room with any new, worsening, concerning symptoms.

## 2020-07-01 NOTE — ED Triage Notes (Signed)
C/o shortness of breath x 2 weeks, states she must sit up to sleep

## 2020-07-01 NOTE — ED Notes (Signed)
Patient ambulated in hallway on room air oxygen saturation stayed at 100%, upon sitting down in room, pt feels like her heart is racing.

## 2020-07-01 NOTE — ED Provider Notes (Signed)
[facility] EMERGENCY DEPARTMENT Provider Note   CSN: [id number] Arrival date & time: 07/01/20  1619     History Chief Complaint  Patient presents with  . Shortness of Breath    Leslie Bowers is a 50 y.o. female presenting for evaluation of shortness of breath.  Patient states that the past 4 months, she has had gradually worsening leg swelling.  She reports a unintentional 30 pound weight gain over the past 3 months, despite decreased p.o. intake.  She reports that the past 2 weeks she has had worsening shortness of breath, especially with exertion and when laying flat.  No history of similar.  She denies fevers or chills.  She has a chronic smoker's cough, this is unchanged.  No chest pain.  No nausea or vomiting.  She does report urinary frequency, but no dysuria or hematuria.  No change in bowel movements.  No history of heart failure, she is not on any diuretics.  HPI     Past Medical History:  Diagnosis Date  . Chronic back pain   . Endometriosis   . Lyme disease     Patient Active Problem List   Diagnosis Date Noted  . Vaginal atrophy 06/08/2019  . Postcoital bleeding 06/08/2019    Past Surgical History:  Procedure Laterality Date  . ABDOMINAL HYSTERECTOMY    . CESAREAN SECTION    . GASTRIC BYPASS       OB History    Gravida  3   Para      Term      Preterm      AB      Living  3     SAB      IAB      Ectopic      Multiple      Live Births              Family History  Problem Relation Age of Onset  . Diabetes Paternal Grandmother   . Heart disease Paternal Grandmother   . Diabetes Father   . Heart disease Father   . Kidney disease Father     Social History   Tobacco Use  . Smoking status: Current Every Day Smoker    Packs/day: 1.00    Types: Cigarettes  . Smokeless tobacco: Never Used  Vaping Use  . Vaping Use: Never used  Substance Use Topics  . Alcohol use: No  . Drug use: No    Home Medications Prior to  Admission medications   Medication Sig Start Date End Date Taking? Authorizing Provider  furosemide (LASIX) 20 MG tablet Take 1 tablet (20 mg total) by mouth daily for 5 days. 07/01/20 07/06/20 Yes Rochanda Harpham, PA-C  Aspirin-Acetaminophen-Caffeine (GOODY HEADACHE PO) Take 1-2 packets by mouth daily as needed (for pain).    [provider]  baclofen (LIORESAL) 10 MG tablet Take 10 mg by mouth 6 (six) times daily.  01/10/19   [provider]  buprenorphine (SUBUTEX) 8 MG SUBL SL tablet Place 8 mg under the tongue 2 (two) times daily.  01/10/19   [provider]  conjugated estrogens (PREMARIN) vaginal cream Use 0.5 gm in vagina bid for 2 weeks then 2 x weekly 06/08/19   Adline Potter, NP  doxycycline (VIBRAMYCIN) 100 MG capsule One po bid 06/21/19   Bethann Berkshire, MD  meloxicam (MOBIC) 7.5 MG tablet Take 7.5 mg by mouth every other day as needed for pain.  12/14/18   [provider]  Pseudoephedrine-APAP-DM (  DAYQUIL MULTI-SYMPTOM COLD/FLU PO) Take 10-15 mLs by mouth daily as needed (for cold symptoms).    [provider]  traMADol (ULTRAM) 50 MG tablet Take 1 tablet (50 mg total) by mouth every 6 (six) hours as needed. Patient taking differently: Take 50 mg by mouth every 6 (six) hours as needed for moderate pain or severe pain.  03/30/15   Donnetta Hutching, MD    Allergies    Dilaudid [hydromorphone hcl], Gabapentin, and Duloxetine  Review of Systems   Review of Systems  Respiratory: Positive for shortness of breath.   Cardiovascular: Positive for leg swelling.  Genitourinary: Positive for frequency.  All other systems reviewed and are negative.   Physical Exam Updated Vital Signs BP (!) 151/81 (BP Location: Left Arm)   Pulse 67   Temp 98.5 F (36.9 C) (Oral)   Resp 20   Ht 5\' 4"  (1.626 m)   Wt 90.7 kg   SpO2 100%   BMI 34.33 kg/m   Physical Exam Vitals and nursing note reviewed.  Constitutional:      General: She is not in acute  distress.    Appearance: She is well-developed.     Comments: nontoxic  HENT:     Head: Normocephalic and atraumatic.  Eyes:     Conjunctiva/sclera: Conjunctivae normal.     Pupils: Pupils are equal, round, and reactive to light.  Cardiovascular:     Rate and Rhythm: Normal rate and regular rhythm.  Pulmonary:     Effort: Pulmonary effort is normal. No respiratory distress.     Breath sounds: Normal breath sounds. No wheezing.     Comments: Speaking in full sentences.  Clear lung sounds in all fields. Abdominal:     General: There is no distension.     Palpations: Abdomen is soft. There is no mass.     Tenderness: There is no abdominal tenderness. There is no guarding or rebound.  Musculoskeletal:        General: Normal range of motion.     Cervical back: Normal range of motion and neck supple.     Right lower leg: Edema present.     Left lower leg: Edema present.     Comments: 2+ pitting edema bilaterally.  No calf tenderness, erythema, or warmth.  Skin:    General: Skin is warm and dry.     Capillary Refill: Capillary refill takes less than 2 seconds.  Neurological:     Mental Status: She is alert and oriented to person, place, and time.     ED Results / Procedures / Treatments   Labs (all labs ordered are listed, but only abnormal results are displayed) Labs Reviewed  COMPREHENSIVE METABOLIC PANEL - Abnormal; Notable for the following components:      Result Value   Glucose, Bld 102 (*)    Calcium 8.8 (*)    All other components within normal limits  CBC WITH DIFFERENTIAL/PLATELET  LIPASE, BLOOD  URINALYSIS, ROUTINE W REFLEX MICROSCOPIC  BRAIN NATRIURETIC PEPTIDE  CBG MONITORING, ED    EKG EKG Interpretation  Date/Time:  Monday Jul 01 2020 17:01:24 EDT Ventricular Rate:  66 PR Interval:  166 QRS Duration: 88 QT Interval:  404 QTC Calculation: 423 R Axis:   18 Text Interpretation: Normal sinus rhythm Low voltage QRS Borderline ECG No significant change  since last tracing Confirmed by 05-11-1985 [phone]) on 07/01/2020 5:37:24 PM   Radiology DG Chest 2 View  Result Date: 07/01/2020 CLINICAL DATA:  Shortness of breath  for 2 weeks, orthopnea EXAM: CHEST - 2 VIEW COMPARISON:  06/21/2019 FINDINGS: Normal heart size, mediastinal contours, and pulmonary vascularity. Minimal bibasilar subsegmental atelectasis. Lungs otherwise clear. No acute infiltrate, pleural effusion, or pneumothorax. Osseous structures unremarkable. IMPRESSION: Minimal bibasilar atelectasis. Electronically Signed   By: Ulyses Southward M.D.   On: 07/01/2020 17:39    Procedures Procedures   Medications Ordered in ED Medications  albuterol (VENTOLIN HFA) 108 (90 Base) MCG/ACT inhaler 2 puff (2 puffs Inhalation Given 07/01/20 1937)    ED Course  I have reviewed the triage vital signs and the nursing notes.  Pertinent labs & imaging results that were available during my care of the patient were reviewed by me and considered in my medical decision making (see chart for details).    MDM Rules/Calculators/A&P                          Patient presented for evaluation of leg swelling, unintentional weight gain, dyspnea on exertion and orthopnea.  On exam, patient appears nontoxic.  Speaking full sentences.  Sats stable on room air.  Clear pulmonary exam.  However patient clearly has peripheral edema, concern for new onset heart failure.  Also consider kidney dysfunction.  EKG obtained from triage overall reassuring.  Chest x-ray viewed and independently interpreted by me, no obvious pneumonia pneumothorax and effusion.  Per radiology, there is bibasilar atelectasis.  Will obtain labs, ensure patient is able to ambulate without difficulty and reassess.  As patient does not have chest pain, is not hypoxic, tachycardic, or hypotensive, low suspicion for PE.    Labs interpreted by me, overall reassuring.  Kidney function is normal.  BNP is negative.  After albuterol, patient reports  improvement of symptoms.  She was able to ambulate with 100% O2 sat.  Discussed continued symptomatic treatment, and in the setting of peripheral edema, will trial Lasix.  Encourage close follow-up with PCP.  At this time, patient appears safe for discharge.  Return precautions given.  Patient states she understands and agrees to plan.   Final Clinical Impression(s) / ED Diagnoses Final diagnoses:  Dyspnea on exertion  Leg swelling    Rx / DC Orders ED Discharge Orders         Ordered    furosemide (LASIX) 20 MG tablet  Daily        07/01/20 2256           Alveria Apley, PA-C 07/01/20 2323    Pollyann Savoy, MD 07/01/20 [phone]

## 2020-10-03 ENCOUNTER — Other Ambulatory Visit (HOSPITAL_COMMUNITY): Payer: Self-pay | Admitting: Neurology

## 2020-10-03 DIAGNOSIS — M25522 Pain in left elbow: Secondary | ICD-10-CM

## 2020-10-03 DIAGNOSIS — M25561 Pain in right knee: Secondary | ICD-10-CM

## 2021-11-04 IMAGING — DX DG CHEST 2V
2 series · 2 of 2 positions shown · non-contrast
Comparison: 06/21/2019

CLINICAL DATA: Shortness of breath for 2 weeks, orthopnea

EXAM:
CHEST - 2 VIEW

[chest pa]
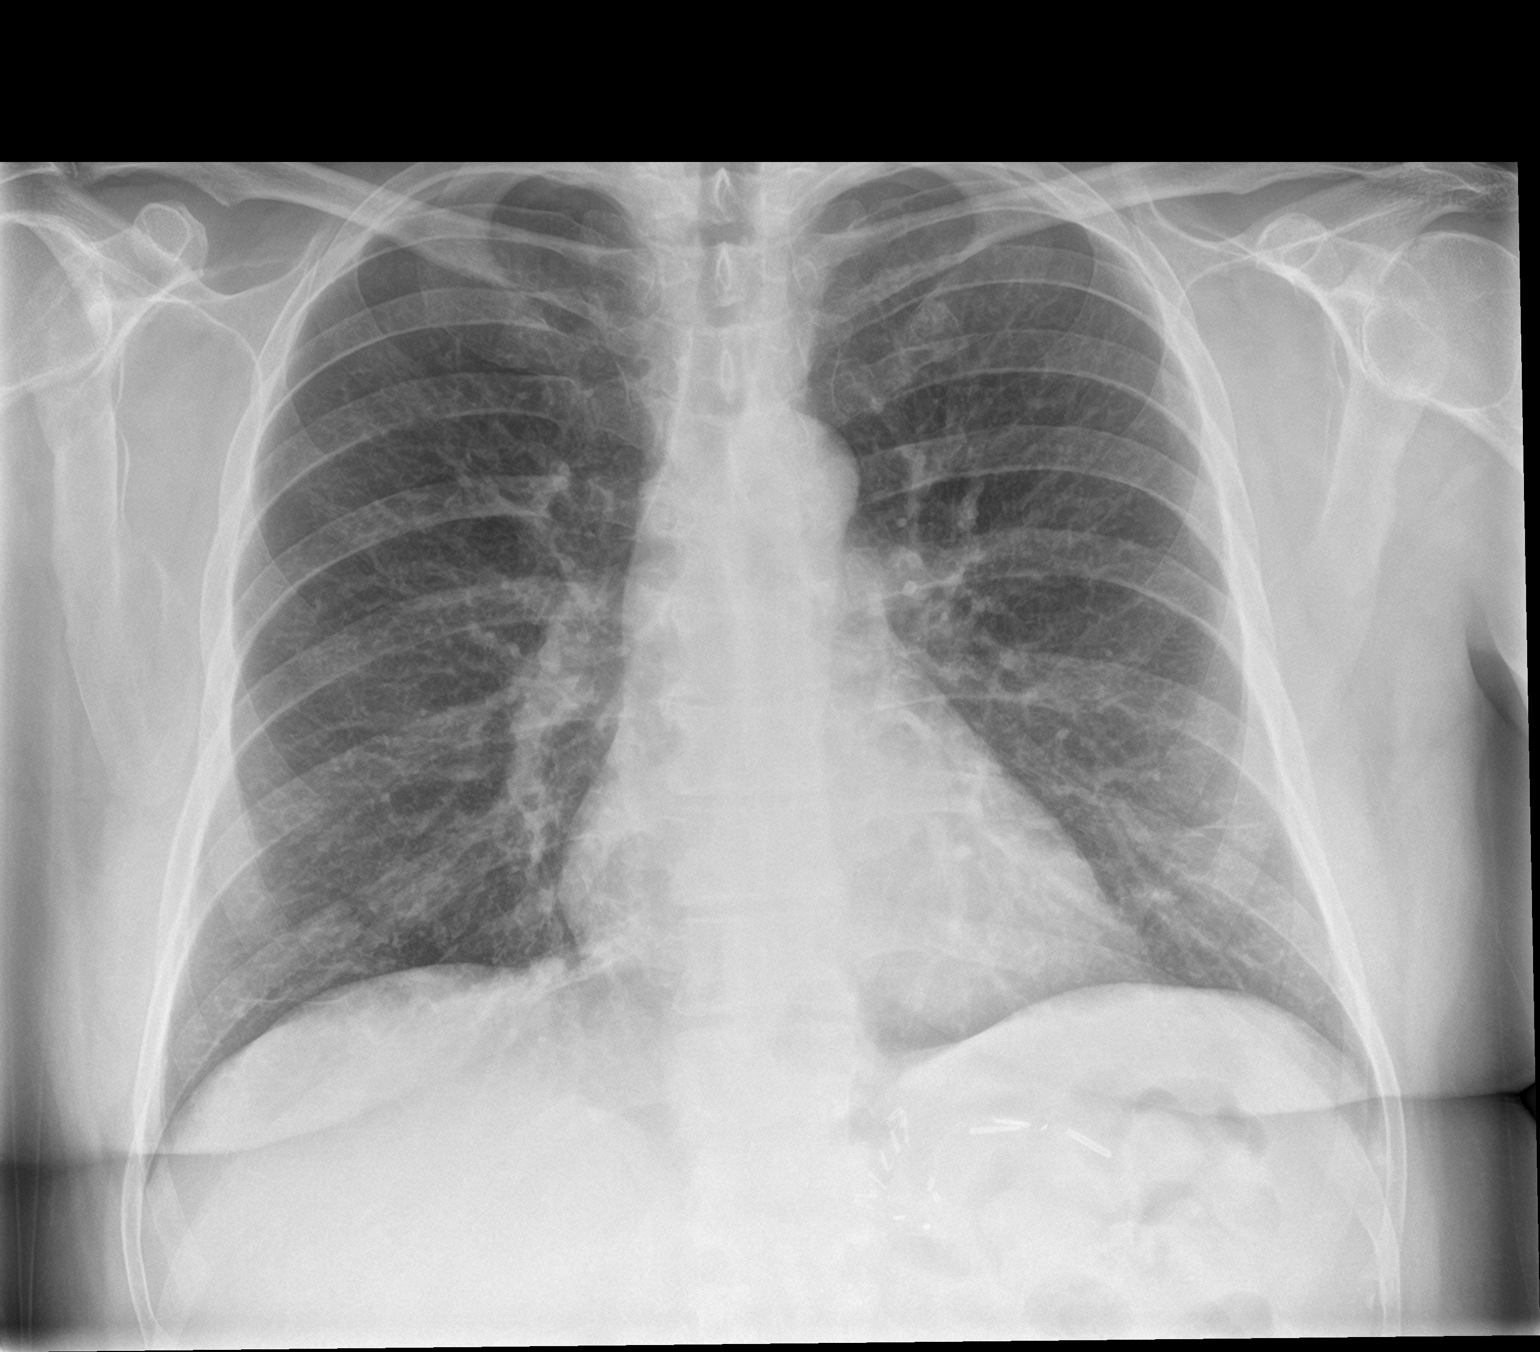

[chest lat]
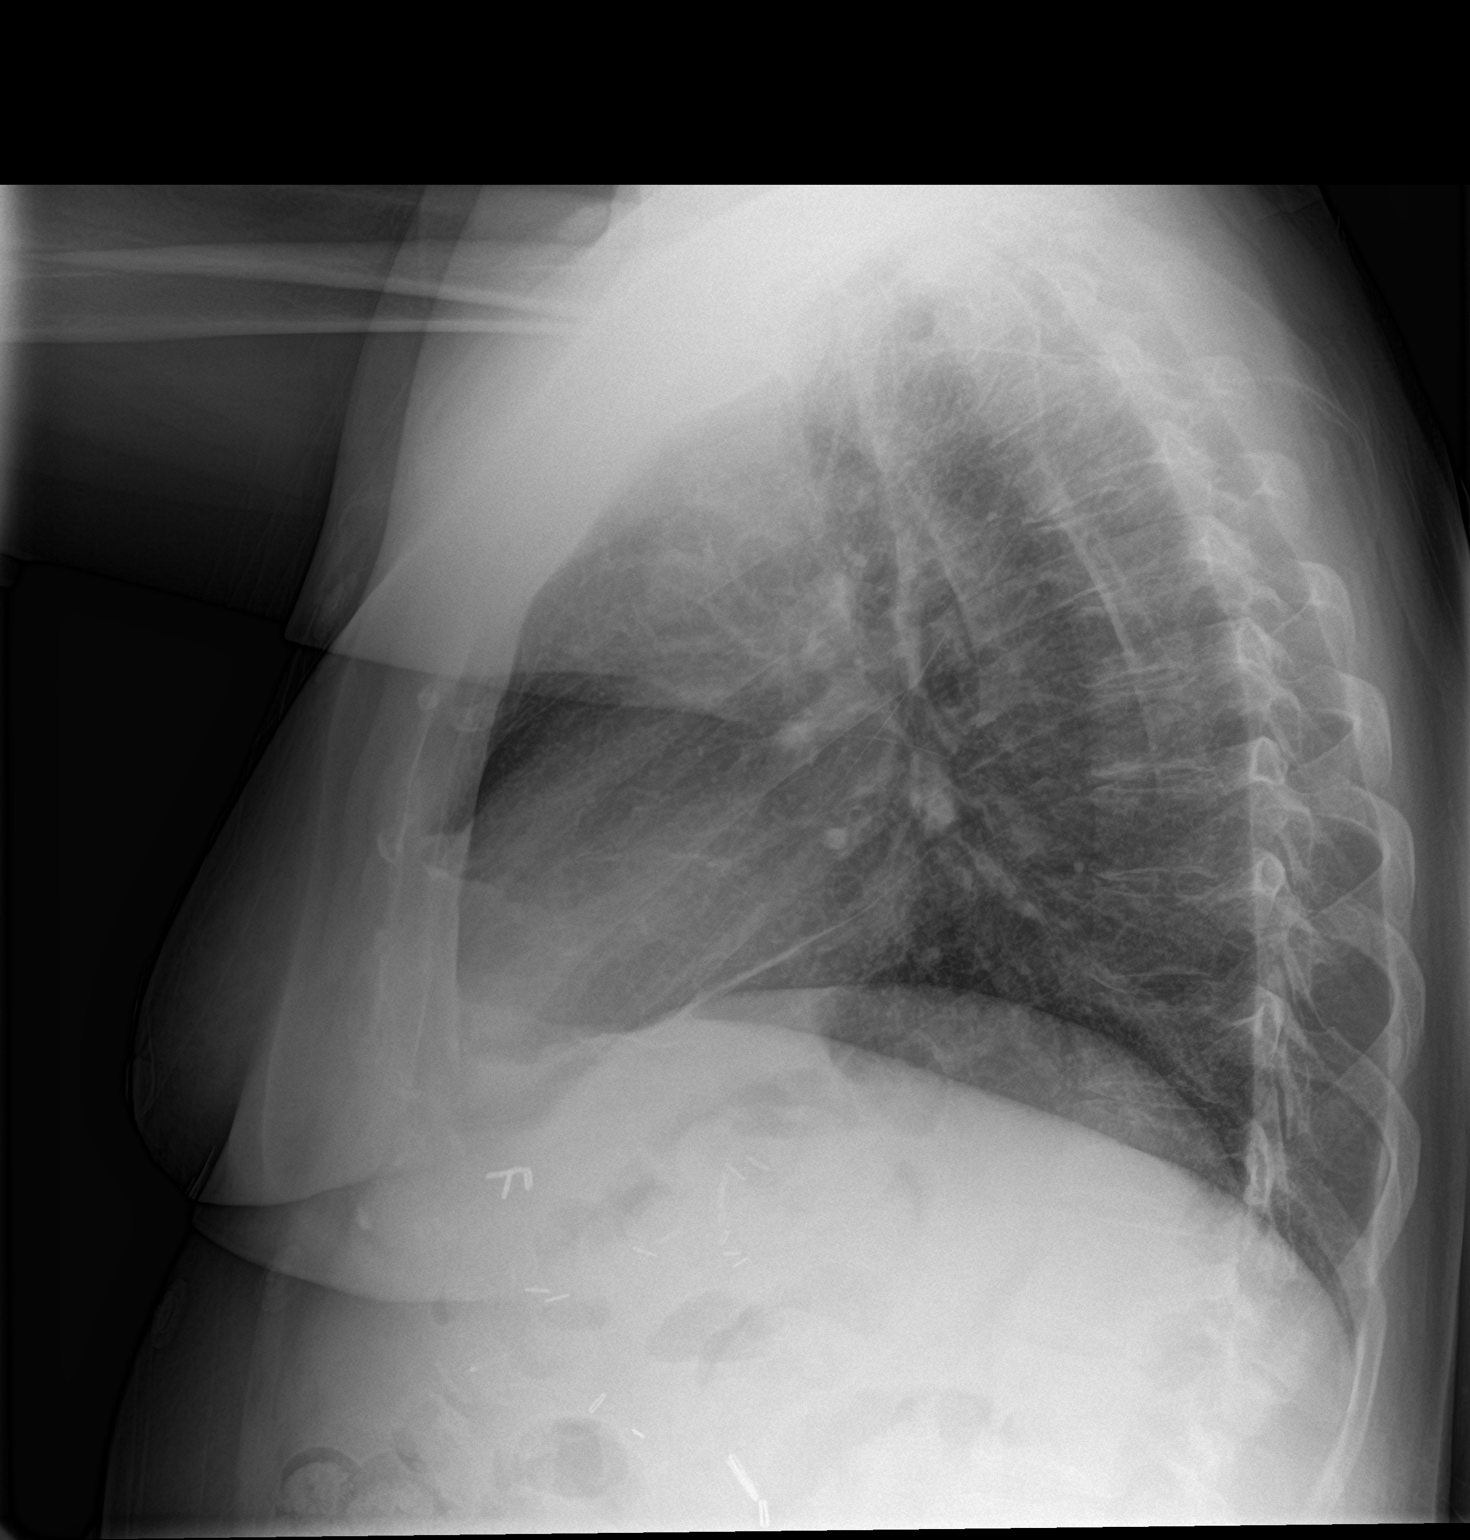

[2 of 2 positions shown; findings below may reference images not displayed]

FINDINGS: Normal heart size, mediastinal contours, and pulmonary vascularity.

Minimal bibasilar subsegmental atelectasis.

Lungs otherwise clear.

No acute infiltrate, pleural effusion, or pneumothorax.

Osseous structures unremarkable.
IMPRESSION: Minimal bibasilar atelectasis.
